# Patient Record
Sex: Female | Born: 1990 | Race: Black or African American | Hispanic: No | Marital: Single | Smoking: Current every day smoker
Health system: Southern US, Community
[De-identification: ages and names within clinical notes are randomized; demographics above are authoritative.]

## PROBLEM LIST (undated history)

## (undated) DIAGNOSIS — Z3490 Encounter for supervision of normal pregnancy, unspecified, unspecified trimester: Secondary | ICD-10-CM

## (undated) DIAGNOSIS — Z789 Other specified health status: Secondary | ICD-10-CM

## (undated) DIAGNOSIS — D219 Benign neoplasm of connective and other soft tissue, unspecified: Secondary | ICD-10-CM

## (undated) HISTORY — PX: NO PAST SURGERIES: SHX2092

## (undated) HISTORY — DX: Other specified health status: Z78.9

---

## 2009-03-29 ENCOUNTER — Emergency Department (HOSPITAL_COMMUNITY): Admission: EM | Admit: 2009-03-29 | Discharge: 2009-03-29 | Payer: Self-pay | Admitting: Emergency Medicine

## 2012-09-02 ENCOUNTER — Emergency Department (HOSPITAL_COMMUNITY)
Admission: EM | Admit: 2012-09-02 | Discharge: 2012-09-03 | Disposition: A | Discharge status: Home / Self Care | Payer: Self-pay | Attending: Emergency Medicine | Admitting: Emergency Medicine

## 2012-09-02 ENCOUNTER — Encounter (HOSPITAL_COMMUNITY): Payer: Self-pay | Admitting: *Deleted

## 2012-09-02 ENCOUNTER — Emergency Department (HOSPITAL_COMMUNITY): Payer: Self-pay

## 2012-09-02 DIAGNOSIS — R498 Other voice and resonance disorders: Secondary | ICD-10-CM | POA: Insufficient documentation

## 2012-09-02 DIAGNOSIS — R51 Headache: Secondary | ICD-10-CM | POA: Insufficient documentation

## 2012-09-02 DIAGNOSIS — M542 Cervicalgia: Secondary | ICD-10-CM | POA: Insufficient documentation

## 2012-09-02 DIAGNOSIS — R509 Fever, unspecified: Secondary | ICD-10-CM | POA: Insufficient documentation

## 2012-09-02 DIAGNOSIS — R599 Enlarged lymph nodes, unspecified: Secondary | ICD-10-CM | POA: Insufficient documentation

## 2012-09-02 DIAGNOSIS — R221 Localized swelling, mass and lump, neck: Secondary | ICD-10-CM | POA: Insufficient documentation

## 2012-09-02 DIAGNOSIS — R1310 Dysphagia, unspecified: Secondary | ICD-10-CM | POA: Insufficient documentation

## 2012-09-02 DIAGNOSIS — R220 Localized swelling, mass and lump, head: Secondary | ICD-10-CM | POA: Insufficient documentation

## 2012-09-02 DIAGNOSIS — J039 Acute tonsillitis, unspecified: Secondary | ICD-10-CM | POA: Insufficient documentation

## 2012-09-02 LAB — RAPID STREP SCREEN (MED CTR MEBANE ONLY): Streptococcus, Group A Screen (Direct): NEGATIVE

## 2012-09-02 MED ORDER — HYDROMORPHONE HCL PF 1 MG/ML IJ SOLN
1.0000 mg | Freq: Once | INTRAMUSCULAR | Status: AC
  Administered 2012-09-02: 1 mg via INTRAVENOUS
  Filled 2012-09-02: qty 1

## 2012-09-02 MED ORDER — ACETAMINOPHEN-CODEINE 120-12 MG/5ML PO SOLN
10.0000 mL | Freq: Once | ORAL | Status: AC
  Administered 2012-09-02: 10 mL via ORAL
  Filled 2012-09-02: qty 10

## 2012-09-02 MED ORDER — IOHEXOL 300 MG/ML  SOLN
75.0000 mL | Freq: Once | INTRAMUSCULAR | Status: AC | PRN
  Administered 2012-09-02: 75 mL via INTRAVENOUS

## 2012-09-02 MED ORDER — CLINDAMYCIN HCL 150 MG PO CAPS: 150.0000 mg | ORAL_CAPSULE | Freq: Four times a day (QID) | ORAL | Status: DC

## 2012-09-02 MED ORDER — DEXAMETHASONE SODIUM PHOSPHATE 4 MG/ML IJ SOLN
10.0000 mg | Freq: Once | INTRAMUSCULAR | Status: AC
  Administered 2012-09-02: 10 mg via INTRAVENOUS
  Filled 2012-09-02: qty 3

## 2012-09-02 MED ORDER — CLINDAMYCIN PHOSPHATE 600 MG/50ML IV SOLN
600.0000 mg | Freq: Once | INTRAVENOUS | Status: AC
  Administered 2012-09-02: 600 mg via INTRAVENOUS
  Filled 2012-09-02: qty 50

## 2012-09-02 MED ORDER — HYDROCODONE-ACETAMINOPHEN 5-325 MG PO TABS: 1.0000 | ORAL_TABLET | ORAL | Status: DC | PRN

## 2012-09-02 NOTE — ED Notes (Signed)
Sore throat for 1 week

## 2012-09-02 NOTE — ED Provider Notes (Signed)
History     CSN: [id number]  Arrival date & time 09/02/12  4098   First MD Initiated Contact with Patient 09/02/12 1928      Chief Complaint  Patient presents with  . Sore Throat    (Consider location/radiation/quality/duration/timing/severity/associated sxs/prior treatment) Patient is a 22 y.o. female presenting with pharyngitis. The history is provided by the patient.  Sore Throat This is a new problem. The current episode started in the past 7 days. The problem occurs constantly. The problem has been gradually worsening. Associated symptoms include a fever, headaches, neck pain, a sore throat and swollen glands. Pertinent negatives include no congestion, coughing, nausea, rash or vomiting. The symptoms are aggravated by eating and swallowing.   Alyssa Owens is a  22 y.o. female who presents to the ED with sore throat and difficulty swallowing. The symptoms started a week ago and have gotten worse. She has noted swelling on the left side of her neck and severe pain with trying to eat solid foods.   History reviewed. No pertinent past medical history.  History reviewed. No pertinent past surgical history.  History reviewed. No pertinent family history.  History  Substance Use Topics  . Smoking status: Never Smoker   . Smokeless tobacco: Not on file  . Alcohol Use: No    OB History   Grav Para Term Preterm Abortions TAB SAB Ect Mult Living                  Review of Systems  Constitutional: Positive for fever.  HENT: Positive for sore throat, trouble swallowing, neck pain and voice change. Negative for congestion.   Respiratory: Negative for cough.   Gastrointestinal: Negative for nausea and vomiting.  Skin: Negative for rash.  Allergic/Immunologic: Negative for immunocompromised state.  Neurological: Positive for headaches.  Psychiatric/Behavioral: Negative for confusion. The patient is not nervous/anxious.     Allergies  Review of patient's allergies indicates  no known allergies.  Home Medications  No current outpatient prescriptions on file.  BP 114/74  Pulse 98  Temp(Src) 100.3 F (37.9 C) (Oral)  Resp 20  Ht 5\' 1"  (1.549 m)  Wt 103 lb (46.72 kg)  BMI 19.47 kg/m2  SpO2 99%  LMP 08/06/2012  Physical Exam  Nursing note and vitals reviewed. Constitutional: She is oriented to person, place, and time. She appears well-developed and well-nourished. No distress.  HENT:  Head: Normocephalic and atraumatic.  Right Ear: Tympanic membrane normal.  Left Ear: Tympanic membrane normal.  Nose: Nose normal.  Mouth/Throat: Tonsillar abscesses present.    Eyes: EOM are normal. Pupils are equal, round, and reactive to light.  Neck: Neck supple.  Cardiovascular: Normal rate and regular rhythm.   Pulmonary/Chest: Effort normal and breath sounds normal.  Abdominal: Soft. There is no tenderness.  Musculoskeletal: Normal range of motion. She exhibits no edema.  Lymphadenopathy:    She has cervical adenopathy.  Neurological: She is alert and oriented to person, place, and time. No cranial nerve deficit.  Skin: Skin is warm and dry.  Psychiatric: She has a normal mood and affect. Her behavior is normal. Judgment and thought content normal.   Ct Soft Tissue Neck W Contrast  09/02/2012  *RADIOLOGY REPORT*  Clinical Data: Sore throat, question left tonsillar abscess  CT NECK WITH CONTRAST  Technique:  Multidetector CT imaging of the neck was performed with intravenous contrast. Sagittal and coronal MPR images reconstructed from axial data set.  Contrast: 75mL OMNIPAQUE IOHEXOL 300 MG/ML  SOLN  Comparison:  None  Findings: Visualized intracranial contents unremarkable. Vascular structures grossly patent. Soft tissue swelling identified in the left parapharyngeal space with a poorly defined area of somewhat lower attenuation approximately 1.6 x 1.1 x 2.1 cm in size. Finding is compatible with inflammatory process/focus of infection, likely phlegmon, not yet  representing a well-defined abscess collection. An additional area of low attenuation is seen left parapharyngeal slightly more superiorly 8 x 4 mm image 25.  Narrowing of the oropharynx with displacement left or right. Associated enlarged level II left cervical lymph nodes, few on right. Superior mediastinum clear. Tiny mucosal retention cyst right maxillary sinus. No acute osseous findings. Lung apices clear. Prevertebral soft tissues normal thickness. No abnormal gas collections in the neck or acute osseous findings.  IMPRESSION: Soft tissue swelling left parapharyngeal space with an area of poorly marginated low attenuation consistent with infection/phlegmon; this does not appear to represent a well- defined drainable abscess collection at this time. Additional tiny 8 mm parapharyngeal focus of low attenuation on the left could represent an additional developing abscess collection. Mass effect upon and mild narrowing of the oral pharynx. Reactive adenopathy predominantly left.  Findings called to Dr. Ignacia Palma at 2206 hours on 09/02/2012.   Original Report Authenticated By: Ulyses Southward, M.D.     ED Course: Discussed with Dr. Rito Ehrlich and he referred to ENT.   Procedures (including critical care time)  Consult with Dr. Jenne Pane, ENT on call, He did not feel the patient needed to be admitted at this time. He request we give patient IV Decadron and antibiotics, d/c home with pain medication, antibiotics but would not need additional prednisone. He wants to see her in the office on Monday.   MDM  22 y.o. female with infected tonsils. I have reviewed this patient's vital signs, nurses notes and CT scan. I have discussed with the patient in detail results and plan of care. Patient voices understanding.    Medication List    TAKE these medications       clindamycin 150 MG capsule  Commonly known as:  CLEOCIN  Take 1 capsule (150 mg total) by mouth every 6 (six) hours.     HYDROcodone-acetaminophen 5-325 MG  per tablet  Commonly known as:  NORCO/VICODIN  Take 1 tablet by mouth every 4 (four) hours as needed.           Janne Napoleon, [place] 09/02/12 2340

## 2012-09-03 NOTE — ED Provider Notes (Signed)
Medical screening examination/treatment/procedure(s) were conducted as a shared visit with non-physician practitioner(s) and myself.  I personally evaluated the patient during the encounter 22 yo woman has 7 day Hx sore throat, left tonsillar and left soft palate swelling.  CT shows a phlegmon but no peritonsillar abscess.  Advised by ENT consultant to give ABX, steroid injection, F/U in his office in 3 days.  Carleene Cooper III, MD 09/03/12 [phone]

## 2013-02-06 ENCOUNTER — Encounter (HOSPITAL_COMMUNITY): Payer: Self-pay | Admitting: *Deleted

## 2013-02-06 ENCOUNTER — Emergency Department (HOSPITAL_COMMUNITY)
Admission: EM | Admit: 2013-02-06 | Discharge: 2013-02-06 | Disposition: A | Discharge status: Home / Self Care | Payer: Self-pay | Attending: Emergency Medicine | Admitting: Emergency Medicine

## 2013-02-06 DIAGNOSIS — R509 Fever, unspecified: Secondary | ICD-10-CM | POA: Insufficient documentation

## 2013-02-06 DIAGNOSIS — J039 Acute tonsillitis, unspecified: Secondary | ICD-10-CM | POA: Insufficient documentation

## 2013-02-06 DIAGNOSIS — IMO0001 Reserved for inherently not codable concepts without codable children: Secondary | ICD-10-CM | POA: Insufficient documentation

## 2013-02-06 MED ORDER — MAGIC MOUTHWASH W/LIDOCAINE: 5.0000 mL | Freq: Three times a day (TID) | ORAL | Status: DC

## 2013-02-06 MED ORDER — AMOXICILLIN 500 MG PO CAPS: 500.0000 mg | ORAL_CAPSULE | Freq: Three times a day (TID) | ORAL | Status: DC

## 2013-02-06 NOTE — ED Notes (Signed)
Sore throat for 1 week, body aches

## 2013-02-08 NOTE — ED Provider Notes (Signed)
CSN: [id number]     Arrival date & time 02/06/13  1217 History   First MD Initiated Contact with Patient 02/06/13 1230     Chief Complaint  Patient presents with  . Sore Throat   (Consider location/radiation/quality/duration/timing/severity/associated sxs/prior Treatment) Patient is a 22 y.o. female presenting with pharyngitis. The history is provided by the patient.  Sore Throat This is a new problem. The current episode started in the past 7 days. The problem occurs constantly. The problem has been unchanged. Associated symptoms include a fever, myalgias, a sore throat and swollen glands. Pertinent negatives include no abdominal pain, arthralgias, chills, congestion, coughing, headaches, nausea, neck pain, numbness, rash, visual change, vomiting or weakness. The symptoms are aggravated by swallowing. She has tried nothing for the symptoms. The treatment provided no relief.    History reviewed. No pertinent past medical history. History reviewed. No pertinent past surgical history. History reviewed. No pertinent family history. History  Substance Use Topics  . Smoking status: Never Smoker   . Smokeless tobacco: Not on file  . Alcohol Use: No   OB History   Grav Para Term Preterm Abortions TAB SAB Ect Mult Living                 Review of Systems  Constitutional: Positive for fever. Negative for chills, activity change and appetite change.  HENT: Positive for sore throat. Negative for ear pain, congestion, facial swelling, trouble swallowing, neck pain, neck stiffness and voice change.   Eyes: Negative for pain and visual disturbance.  Respiratory: Negative for cough and shortness of breath.   Gastrointestinal: Negative for nausea, vomiting and abdominal pain.  Musculoskeletal: Positive for myalgias. Negative for arthralgias.  Skin: Negative for color change and rash.  Neurological: Negative for dizziness, facial asymmetry, speech difficulty, weakness, numbness and headaches.   Hematological: Negative for adenopathy.  All other systems reviewed and are negative.    Allergies  Review of patient's allergies indicates no known allergies.  Home Medications   Current Outpatient Rx  Name  Route  Sig  Dispense  Refill  . Alum & Mag Hydroxide-Simeth (MAGIC MOUTHWASH W/LIDOCAINE) SOLN   Oral   Take 5 mLs by mouth 3 (three) times daily. Swish and spit , do not swallow   120 mL   0   . amoxicillin (AMOXIL) 500 MG capsule   Oral   Take 1 capsule (500 mg total) by mouth 3 (three) times daily. For 10 days   30 capsule   0   . clindamycin (CLEOCIN) 150 MG capsule   Oral   Take 1 capsule (150 mg total) by mouth every 6 (six) hours.   28 capsule   0   . HYDROcodone-acetaminophen (NORCO/VICODIN) 5-325 MG per tablet   Oral   Take 1 tablet by mouth every 4 (four) hours as needed.   20 tablet   0    BP 120/67  Pulse 105  Temp(Src) 100.5 F (38.1 C)  Resp 18  Ht 5\' 1"  (1.549 m)  Wt 110 lb (49.896 kg)  BMI 20.8 kg/m2  SpO2 100%  LMP 01/23/2013 Physical Exam  Nursing note and vitals reviewed. Constitutional: She is oriented to person, place, and time. She appears well-developed and well-nourished. No distress.  HENT:  Head: Normocephalic and atraumatic.  Right Ear: Tympanic membrane and ear canal normal.  Left Ear: Tympanic membrane and ear canal normal.  Mouth/Throat: Uvula is midline and mucous membranes are normal. No trismus in the jaw. No edematous. Oropharyngeal exudate,  posterior oropharyngeal edema and posterior oropharyngeal erythema present. No tonsillar abscesses.  Neck: Normal range of motion, full passive range of motion without pain and phonation normal. Neck supple.  Cardiovascular: Normal rate, regular rhythm and normal heart sounds.   No murmur heard. Pulmonary/Chest: Effort normal and breath sounds normal. No stridor. No respiratory distress.  Abdominal: Soft. She exhibits no distension. There is no hepatosplenomegaly or splenomegaly.  There is no tenderness.  Musculoskeletal: Normal range of motion.  Lymphadenopathy:    She has cervical adenopathy.       Right cervical: Superficial cervical adenopathy present.       Left cervical: Superficial cervical adenopathy present.  Neurological: She is alert and oriented to person, place, and time. She exhibits normal muscle tone. Coordination normal.  Skin: Skin is warm and dry.    ED Course  Procedures (including critical care time) Labs Review Labs Reviewed - No data to display Imaging Review No results found.  MDM   1. Tonsillitis     Airway patent,  Pt handles secretions well.  No PTA.  Appears stable for discharge.  Return precautions given.  Will treat with magic mouthwash and amoxil   Kelita Wallis L. Trisha Mangle, PA-C 02/08/13 1431

## 2013-02-11 NOTE — ED Provider Notes (Signed)
Medical screening examination/treatment/procedure(s) were performed by non-physician practitioner and as supervising physician I was immediately available for consultation/collaboration.    Rajveer Handler J. Enio Hornback, MD 02/11/13 0728 

## 2013-07-31 ENCOUNTER — Emergency Department (HOSPITAL_COMMUNITY)
Admission: EM | Admit: 2013-07-31 | Discharge: 2013-07-31 | Disposition: A | Discharge status: Home / Self Care | Payer: Self-pay | Attending: Emergency Medicine | Admitting: Emergency Medicine

## 2013-07-31 ENCOUNTER — Encounter (HOSPITAL_COMMUNITY): Payer: Self-pay | Admitting: Emergency Medicine

## 2013-07-31 DIAGNOSIS — J039 Acute tonsillitis, unspecified: Secondary | ICD-10-CM | POA: Insufficient documentation

## 2013-07-31 MED ORDER — MAGIC MOUTHWASH W/LIDOCAINE: 5.0000 mL | Freq: Three times a day (TID) | ORAL | Status: DC | PRN

## 2013-07-31 MED ORDER — AMOXICILLIN 500 MG PO CAPS: 500.0000 mg | ORAL_CAPSULE | Freq: Three times a day (TID) | ORAL | Status: DC

## 2013-07-31 NOTE — Discharge Instructions (Signed)
Tonsillitis °Tonsillitis is an infection of the throat. This infection causes the tonsils to become red, tender, and puffy (swollen). Tonsils are groups of tissue at the back of your throat. If bacteria caused your infection, antibiotic medicine will be given to you. Sometimes symptoms of tonsillitis can be relieved with the use of steroid medicine. If your tonsillitis is severe and happens often, you may need to get your tonsils removed (tonsillectomy). °HOME CARE  °· Rest and sleep often. °· Drink enough fluids to keep your pee (urine) clear or pale yellow. °· While your throat is sore, eat soft or liquid foods like: °· Soup. °· Ice cream. °· Instant breakfast drinks. °· Eat frozen ice pops. °· Gargle with a warm or cold liquid to help soothe the throat. Gargle with a water and salt mix. Mix 1/4 teaspoon of salt and 1/4 teaspoon of baking soda in 1 cup of water. °· Only take medicines as told by your doctor. °· If you are given medicines (antibiotics), take them as told. Finish them even if you start to feel better. °GET HELP RIGHT AWAY IF:  °· You throw up (vomit). °· You have a very bad headache. °· You have a stiff neck. °· You have chest pain. °· You have trouble breathing or swallowing. °· You have bad throat pain, drooling, or your voice changes. °· You have bad pain not helped by medicine. °· You cannot fully open your mouth. °· You have redness, puffiness, or bad pain in the neck. °· You have a fever. °· You have a rash. °· You cough up green, yellow-brown, or bloody fluid. °· You cannot swallow liquids or food for 24 hours. °· You notice that only one of your tonsils is swollen. °MAKE SURE YOU:  °· Understand these instructions. °· Will watch your condition. °· Will get help right away if you are not doing well or get worse. °Document Released: 11/11/2007 Document Revised: 01/25/2013 Document Reviewed: 11/11/2012 °[employer]® Patient Information ©2014 [employer]. ° °

## 2013-07-31 NOTE — ED Notes (Signed)
Pt c/o sore throat x2 days 

## 2013-07-31 NOTE — ED Provider Notes (Signed)
CSN: [id number]     Arrival date & time 07/31/13  1119 History  This chart was scribed for non-physician practitioner Hale Bogus, PA-C working with Nat Christen, MD by Anastasia Pall, ED scribe. This patient was seen in room APFT22/APFT22 and the patient's care was started at 12:24 PM.    Chief Complaint  Patient presents with  . Sore Throat   (Consider location/radiation/quality/duration/timing/severity/associated sxs/prior Treatment) Patient is a 23 y.o. female presenting with pharyngitis. The history is provided by the patient. No language interpreter was used.  Sore Throat This is a new problem. The current episode started 2 days ago. The problem occurs constantly. The problem has not changed since onset.Pertinent negatives include no chest pain, no abdominal pain and no shortness of breath. The symptoms are aggravated by swallowing. She has tried nothing for the symptoms.   HPI Comments: Alyssa Owens is a 23 y.o. female who presents to the Emergency Department complaining of constant, sore throat, onset 2 days ago. She reports not eating as much due to pain when swallowing. She denies being around any contacts with strep throat recently. She reports h/o tonsillitis. She reports having similar sore throat 4 times in past year. She denies having PCP. She denies fever, cough, congestion, and any other associated symptoms. She denies allergy to antibiotics. She last took antibiotics several months ago.   PCP - No PCP Per Patient  History reviewed. No pertinent past medical history. History reviewed. No pertinent past surgical history. No family history on file. History  Substance Use Topics  . Smoking status: Never Smoker   . Smokeless tobacco: Not on file  . Alcohol Use: No   OB History   Grav Para Term Preterm Abortions TAB SAB Ect Mult Living                 Review of Systems  Constitutional: Negative for fever, chills and fatigue.  HENT: Positive for sore throat. Negative  for congestion and trouble swallowing.   Respiratory: Negative for cough, shortness of breath and wheezing.   Cardiovascular: Negative for chest pain and palpitations.  Gastrointestinal: Negative for nausea, vomiting, abdominal pain and blood in stool.  Genitourinary: Negative for dysuria, hematuria and flank pain.  Musculoskeletal: Negative for arthralgias, back pain, myalgias, neck pain and neck stiffness.  Skin: Negative for rash.  Neurological: Negative for dizziness, weakness and numbness.  Hematological: Does not bruise/bleed easily.   Allergies  Review of patient's allergies indicates no known allergies.  Home Medications   Current Outpatient Rx  Name  Route  Sig  Dispense  Refill  . Phenylephrine-DM-GG-APAP (TYLENOL COLD/FLU SEVERE) 5-10-200-325 MG TABS   Oral   Take 2 tablets by mouth 2 (two) times daily.          BP 103/54  Pulse 62  Temp(Src) 98.7 F (37.1 C)  Resp 18  Ht 5\' 1"  (1.549 m)  Wt 110 lb (49.896 kg)  BMI 20.80 kg/m2  SpO2 100%  LMP 07/24/2013  Physical Exam  Nursing note and vitals reviewed. Constitutional: She is oriented to person, place, and time. She appears well-developed and well-nourished. No distress.  HENT:  Head: Normocephalic and atraumatic.  Right Ear: Tympanic membrane, external ear and ear canal normal.  Left Ear: Tympanic membrane, external ear and ear canal normal.  Mouth/Throat: Uvula is midline and mucous membranes are normal. Posterior oropharyngeal edema (mild) and posterior oropharyngeal erythema present. No oropharyngeal exudate or tonsillar abscesses.  Eyes: EOM are normal. No scleral icterus.  Neck:  Normal range of motion. Neck supple. No thyromegaly present.  Cardiovascular: Normal rate, regular rhythm and normal heart sounds.   No murmur heard. Pulmonary/Chest: Effort normal and breath sounds normal. No respiratory distress. She has no wheezes. She has no rales. She exhibits no tenderness.  Musculoskeletal: Normal range  of motion. She exhibits no tenderness.  Lymphadenopathy:    She has no cervical adenopathy.  Neurological: She is alert and oriented to person, place, and time. She exhibits normal muscle tone. Coordination normal.  Skin: Skin is warm and dry.   ED Course  Procedures (including critical care time)  DIAGNOSTIC STUDIES: Oxygen Saturation is 100% on room air, normal by my interpretation.    COORDINATION OF CARE: 12:28 PM-Discussed treatment plan with pt at bedside and pt agreed to plan.   Labs Review Labs Reviewed - No data to display Imaging Review No results found.  EKG Interpretation   None      Medications - No data to display  MDM   Final diagnoses:  Tonsillitis   Pt is well appearing, VSS.  No concerning sx's for PTA.  Pt agrees to close f/u with clinic.    The patient appears reasonably screened and/or stabilized for discharge and I doubt any other medical condition or other [facility] requiring further screening, evaluation, or treatment in the ED at this time prior to discharge.   I personally performed the services described in this documentation, which was scribed in my presence. The recorded information has been reviewed and is accurate.    Jaxston Chohan L. Vanessa Avoca, PA-C 08/02/13 2204

## 2013-08-03 NOTE — ED Provider Notes (Signed)
Medical screening examination/treatment/procedure(s) were performed by non-physician practitioner and as supervising physician I was immediately available for consultation/collaboration.  EKG Interpretation   None        Nat Christen, MD 08/03/13 2039

## 2014-01-28 IMAGING — CT CT NECK W/ CM
3 of 4 series · 13 of 33 positions shown, 16 images · IV contrast (omnipaque)
Comparison: None

CLINICAL DATA: Sore throat, question left tonsillar abscess

CT NECK WITH CONTRAST
TECHNIQUE: Multidetector CT imaging of the neck was performed with
intravenous contrast. Sagittal and coronal MPR images reconstructed
from axial data set.
Contrast: 75mL OMNIPAQUE IOHEXOL 300 MG/ML  SOLN

[Series 2: soft tissue neck 2.0 b31s · axial · 0.48mm/px · z∈[+1030,+1180]mm · 5 of 113 slices shown, 7 images]
[im 19/113  soft-tissue]
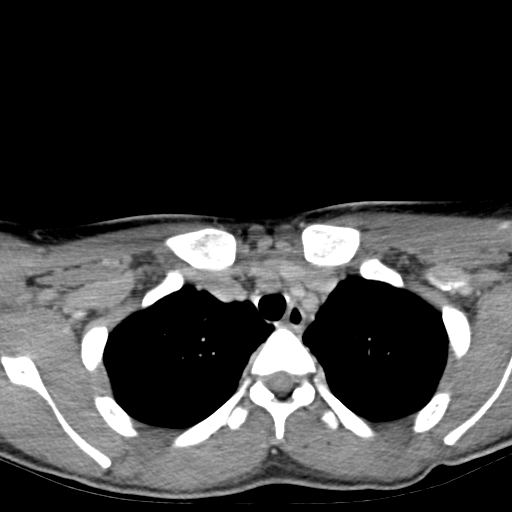
[im 19/113  bone]
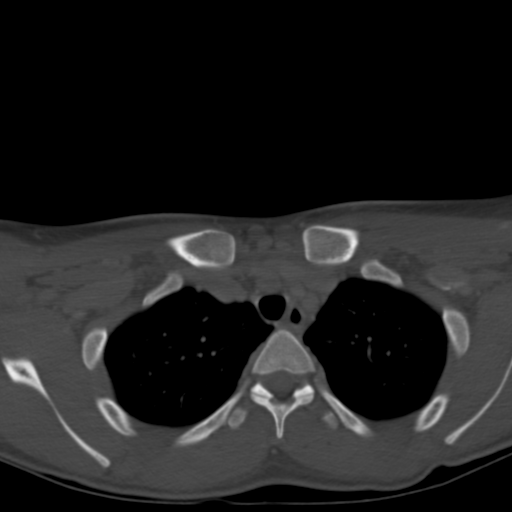
[im 38/113  bone]
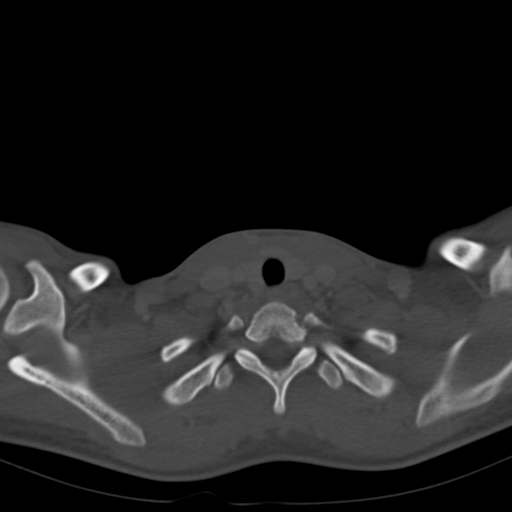
[im 57/113  bone]
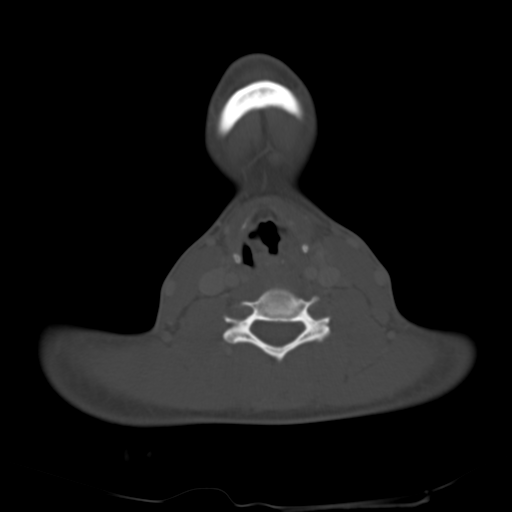
[im 75/113  bone]
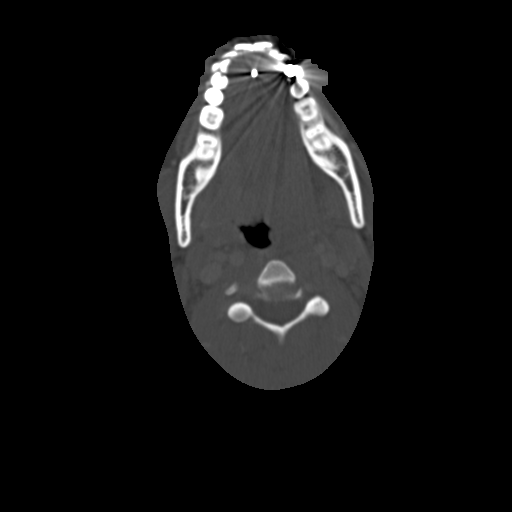
[im 94/113  soft-tissue]
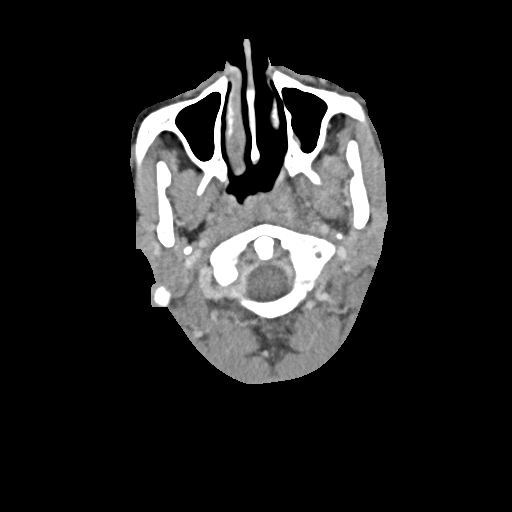
[im 94/113  bone]
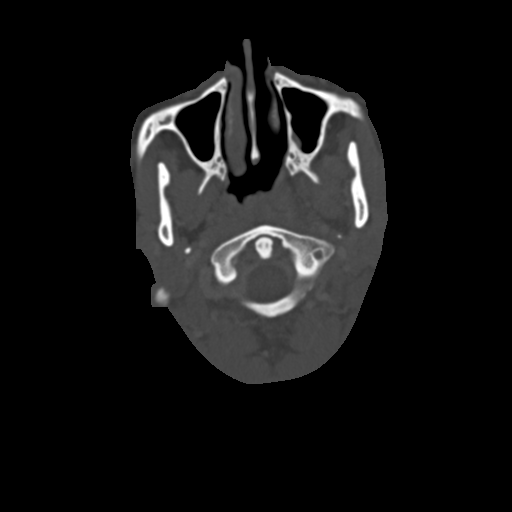

[Series 4: neck 2.0 soft tissue sag · sagittal · 0.40mm/px · 5 of 114 slices shown, 6 images]
[im 38/114  bone]
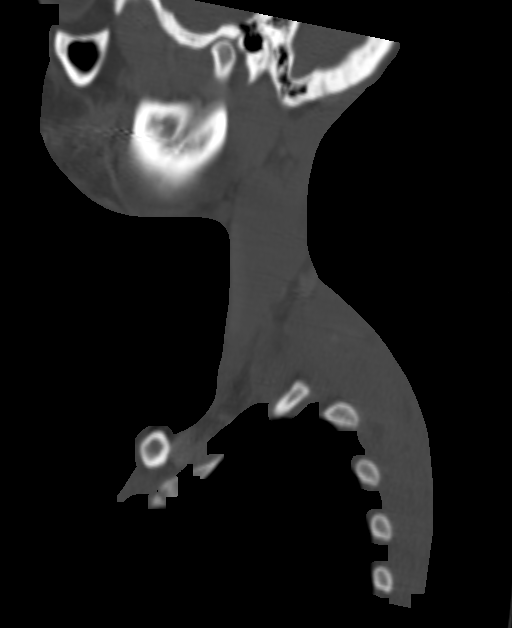
[im 48/114  bone]
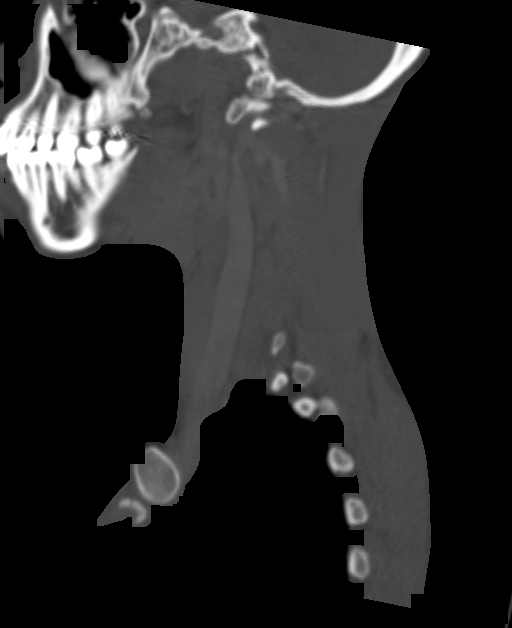
[im 57/114  soft-tissue]
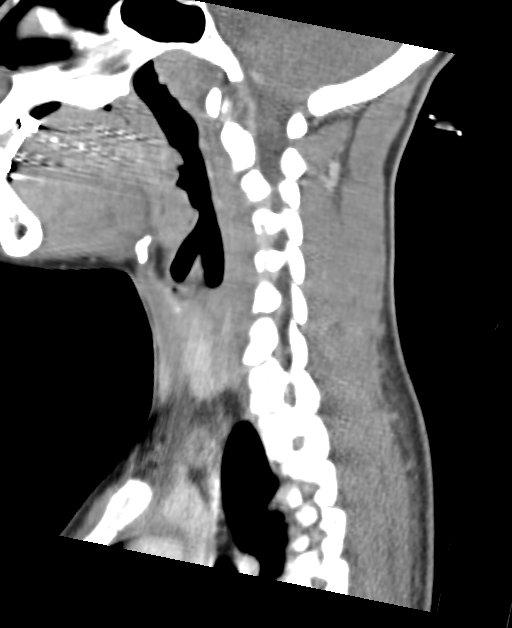
[im 57/114  bone]
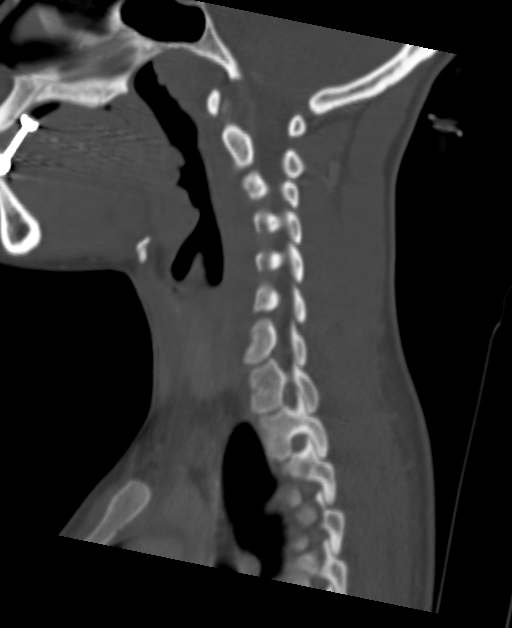
[im 66/114  bone]
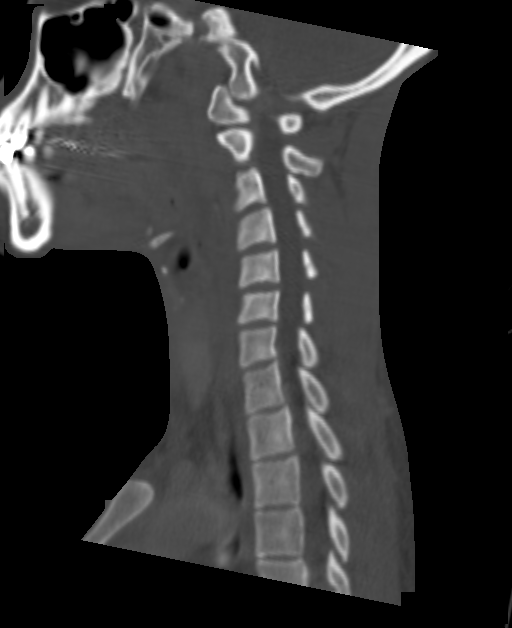
[im 76/114  bone]
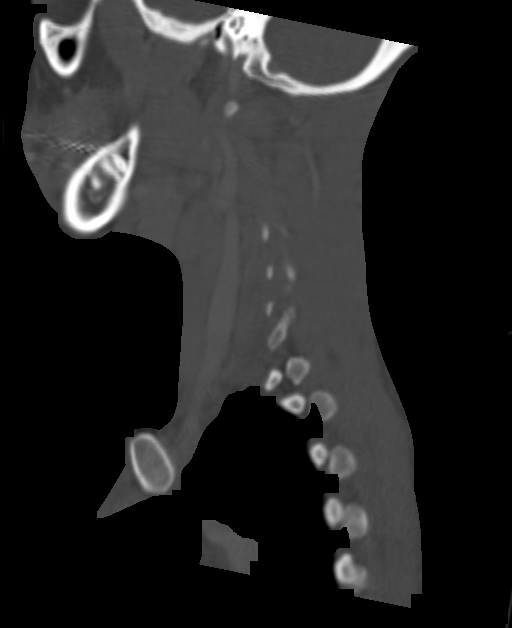

[Series 5: neck 2.0 soft tissue coro · coronal · 0.44mm/px · 3 of 97 slices shown]
[im 20/97  bone]
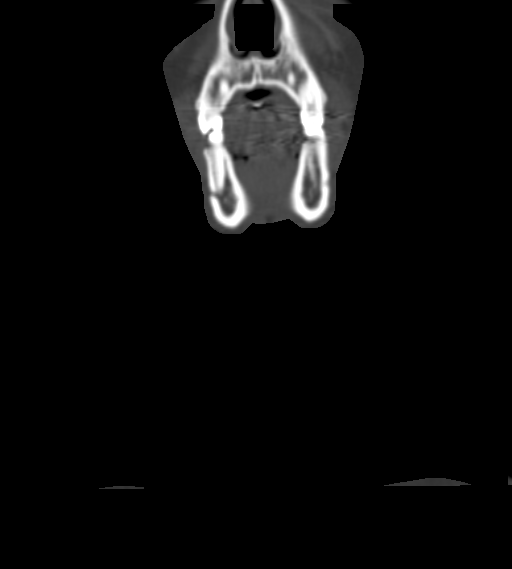
[im 39/97  bone]
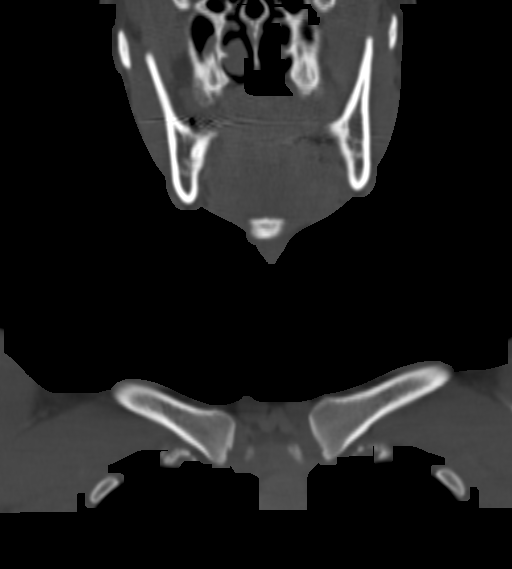
[im 58/97  bone]
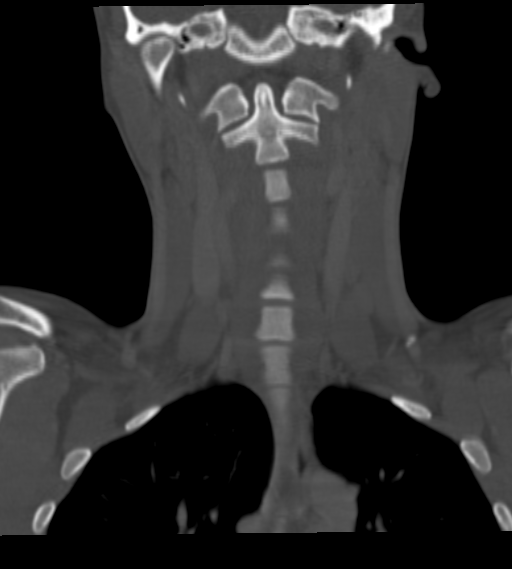

[13 of 33 positions shown; findings below may reference images not displayed]

FINDINGS: Visualized intracranial contents unremarkable.
Vascular structures grossly patent.
Soft tissue swelling identified in the left parapharyngeal space
with a poorly defined area of somewhat lower attenuation
approximately 1.6 x 1.1 x 2.1 cm in size.
Finding is compatible with inflammatory process/focus of infection,
likely phlegmon, not yet representing a well-defined abscess
collection.
An additional area of low attenuation is seen left parapharyngeal
slightly more superiorly 8 x 4 mm image 25.

Narrowing of the oropharynx with displacement left or right.
Associated enlarged level II left cervical lymph nodes, few on
right. Superior mediastinum clear.
Tiny mucosal retention cyst right maxillary sinus.
No acute osseous findings.
Lung apices clear.
Prevertebral soft tissues normal thickness.
No abnormal gas collections in the neck or acute osseous findings.
IMPRESSION: Soft tissue swelling left parapharyngeal space with an area of
poorly marginated low attenuation consistent with
infection/phlegmon; this does not appear to represent a well-
defined drainable abscess collection at this time.
Additional tiny 8 mm parapharyngeal focus of low attenuation on the
left could represent an additional developing abscess collection.
Mass effect upon and mild narrowing of the oral pharynx.
Reactive adenopathy predominantly left.

Findings called to Dr. Chai at 0071 hours on 09/02/2012.

## 2015-03-21 ENCOUNTER — Other Ambulatory Visit: Payer: Self-pay | Admitting: Obstetrics & Gynecology

## 2015-03-21 DIAGNOSIS — O3680X0 Pregnancy with inconclusive fetal viability, not applicable or unspecified: Secondary | ICD-10-CM

## 2015-03-25 ENCOUNTER — Ambulatory Visit (INDEPENDENT_AMBULATORY_CARE_PROVIDER_SITE_OTHER): Payer: Medicaid Other

## 2015-03-25 DIAGNOSIS — O3680X0 Pregnancy with inconclusive fetal viability, not applicable or unspecified: Secondary | ICD-10-CM | POA: Diagnosis not present

## 2015-03-25 NOTE — Progress Notes (Signed)
[place] 7+6wks,single IUP w/ys,pos fht 149 bpm, crl 11.73mm,two post.subserosal fibroids,(#1) post rt 4.3 x 4.6 x 3 cm, (#2) post lt  5.1  X 4.4 x 4cm

## 2015-04-08 ENCOUNTER — Encounter: Payer: Self-pay | Admitting: Women's Health

## 2015-04-08 ENCOUNTER — Ambulatory Visit (INDEPENDENT_AMBULATORY_CARE_PROVIDER_SITE_OTHER): Payer: Medicaid Other | Admitting: Women's Health

## 2015-04-08 VITALS — BP 102/68 | HR 88 | Wt 124.0 lb

## 2015-04-08 DIAGNOSIS — D259 Leiomyoma of uterus, unspecified: Secondary | ICD-10-CM | POA: Insufficient documentation

## 2015-04-08 DIAGNOSIS — Z369 Encounter for antenatal screening, unspecified: Secondary | ICD-10-CM

## 2015-04-08 DIAGNOSIS — Z331 Pregnant state, incidental: Secondary | ICD-10-CM

## 2015-04-08 DIAGNOSIS — Z0283 Encounter for blood-alcohol and blood-drug test: Secondary | ICD-10-CM

## 2015-04-08 DIAGNOSIS — F17200 Nicotine dependence, unspecified, uncomplicated: Secondary | ICD-10-CM | POA: Insufficient documentation

## 2015-04-08 DIAGNOSIS — Z3401 Encounter for supervision of normal first pregnancy, first trimester: Secondary | ICD-10-CM | POA: Diagnosis not present

## 2015-04-08 DIAGNOSIS — O99331 Smoking (tobacco) complicating pregnancy, first trimester: Secondary | ICD-10-CM | POA: Diagnosis not present

## 2015-04-08 DIAGNOSIS — Z3682 Encounter for antenatal screening for nuchal translucency: Secondary | ICD-10-CM

## 2015-04-08 DIAGNOSIS — Z23 Encounter for immunization: Secondary | ICD-10-CM | POA: Diagnosis not present

## 2015-04-08 DIAGNOSIS — D252 Subserosal leiomyoma of uterus: Secondary | ICD-10-CM

## 2015-04-08 DIAGNOSIS — Z1389 Encounter for screening for other disorder: Secondary | ICD-10-CM

## 2015-04-08 DIAGNOSIS — Z3400 Encounter for supervision of normal first pregnancy, unspecified trimester: Secondary | ICD-10-CM | POA: Insufficient documentation

## 2015-04-08 LAB — POCT URINALYSIS DIPSTICK
GLUCOSE UA: NEGATIVE
Leukocytes, UA: NEGATIVE
NITRITE UA: NEGATIVE
Protein, UA: NEGATIVE
RBC UA: NEGATIVE

## 2015-04-08 MED ORDER — PROMETHAZINE HCL 25 MG PO TABS: 12.5000 mg | ORAL_TABLET | Freq: Four times a day (QID) | ORAL | Status: DC | PRN

## 2015-04-08 NOTE — Patient Instructions (Signed)

## 2015-04-08 NOTE — Progress Notes (Signed)
  Subjective:  Alyssa Owens is a 24 y.o. G58P0 African American female at [redacted]w[redacted]d by LMP c/w 7wk u/s, being seen today for her first obstetrical visit.  Her obstetrical history is significant for primigravida, smoker: 1/2ppd prior to pregnancy, now at 5 cigs/day.  Pregnancy history fully reviewed.  Patient reports nausea, some vomiting- requests meds. Denies vb, cramping, uti s/s, abnormal/malodorous vag d/c, or vulvovaginal itching/irritation.  BP 102/68 mmHg  Pulse 88  Wt 124 lb (56.246 kg)  LMP 01/29/2015 (Approximate)  HISTORY: OB History  Gravida Para Term Preterm AB SAB TAB Ectopic Multiple Living  1             # Outcome Date GA Lbr Len/2nd Weight Sex Delivery Anes PTL Lv  1 Current              Past Medical History  Diagnosis Date  . Medical history non-contributory    Past Surgical History  Procedure Laterality Date  . No past surgeries     Family History  Problem Relation Age of Onset  . Arthritis Mother   . Cancer Mother     breast  . Hypertension Mother   . Heart disease Father     Exam   System:     General: Well developed & nourished, no acute distress   Skin: Warm & dry, normal coloration and turgor, no rashes   Neurologic: Alert & oriented, normal mood   Cardiovascular: Regular rate & rhythm   Respiratory: Effort & rate normal, LCTAB, acyanotic   Abdomen: Soft, non tender   Extremities: normal strength, tone  Thin prep pap smear neg 2015 @ [facility] FHR: 181 via doppler   Assessment:   Pregnancy: G1P0 Patient Active Problem List   Diagnosis Date Noted  . Supervision of normal first pregnancy 04/08/2015    Priority: High  . Uterine fibroids 04/08/2015  . Smoker 04/08/2015    [redacted]w[redacted]d G1P0 New OB visit N/V pregnancy Smoker Uterine fibroids  Plan:  Initial labs drawn Continue prenatal vitamins Problem list reviewed and updated Reviewed n/v relief measures and warning s/s to report No diclegis samples available, Mcaid is pending, rx'd  phenergan, to call us back when [facility] goes through and will rx diclegis Reviewed recommended weight gain based on pre-gravid BMI Encouraged well-balanced diet Genetic Screening discussed Integrated Screen: requested Cystic fibrosis screening discussed declined Ultrasound discussed; fetal survey: requested Follow up in 2 weeks for visit and 1st it/nt [facility] completed [facility]FP[facility]nership offered, accepted, referral faxed  Flu shot today Smokes 1/2pp/day, advised cessation, discussed risks to fetus while pregnant, to infant pp, and to herself. Offered QuitlineNC, accepted, referral sent.     Tawnya Crook CNM, [facility] 04/08/2015 9:48 AM

## 2015-04-09 ENCOUNTER — Encounter: Payer: Self-pay | Admitting: Women's Health

## 2015-04-09 DIAGNOSIS — D573 Sickle-cell trait: Secondary | ICD-10-CM | POA: Insufficient documentation

## 2015-04-09 LAB — URINE CULTURE: Organism ID, Bacteria: NO GROWTH

## 2015-04-09 LAB — HEPATITIS B SURFACE ANTIGEN: Hepatitis B Surface Ag: NEGATIVE

## 2015-04-09 LAB — PMP SCREEN PROFILE (10S), URINE
Amphetamine Screen, Ur: NEGATIVE ng/mL
BENZODIAZEPINE SCREEN, URINE: NEGATIVE ng/mL
Barbiturate Screen, Ur: NEGATIVE ng/mL
Cannabinoids Ur Ql Scn: POSITIVE ng/mL
Cocaine(Metab.)Screen, Urine: NEGATIVE ng/mL
Creatinine(Crt), U: 163.9 mg/dL (ref 20.0–300.0)
Methadone Scn, Ur: NEGATIVE ng/mL
OXYCODONE+OXYMORPHONE UR QL SCN: NEGATIVE ng/mL
Opiate Scrn, Ur: NEGATIVE ng/mL
PCP Scrn, Ur: NEGATIVE ng/mL
Ph of Urine: 5.9 (ref 4.5–8.9)
Propoxyphene, Screen: NEGATIVE ng/mL

## 2015-04-09 LAB — CBC
HEMATOCRIT: 41.3 % (ref 34.0–46.6)
HEMOGLOBIN: 14.5 g/dL (ref 11.1–15.9)
MCH: 30.2 pg (ref 26.6–33.0)
MCHC: 35.1 g/dL (ref 31.5–35.7)
MCV: 86 fL (ref 79–97)
NRBC: 0 % (ref 0–0)
Platelets: 201 10*3/uL (ref 150–379)
RBC: 4.8 x10E6/uL (ref 3.77–5.28)
RDW: 14.3 % (ref 12.3–15.4)
WBC: 12.8 10*3/uL — AB (ref 3.4–10.8)

## 2015-04-09 LAB — GC/CHLAMYDIA PROBE AMP
CHLAMYDIA, DNA PROBE: NEGATIVE
Neisseria gonorrhoeae by PCR: NEGATIVE

## 2015-04-09 LAB — URINALYSIS, ROUTINE W REFLEX MICROSCOPIC
BILIRUBIN UA: NEGATIVE
Glucose, UA: NEGATIVE
Nitrite, UA: NEGATIVE
RBC UA: NEGATIVE
SPEC GRAV UA: 1.016 (ref 1.005–1.030)
Urobilinogen, Ur: 0.2 mg/dL (ref 0.2–1.0)
pH, UA: 6.5 (ref 5.0–7.5)

## 2015-04-09 LAB — MICROSCOPIC EXAMINATION: CASTS: NONE SEEN /LPF

## 2015-04-09 LAB — HIV ANTIBODY (ROUTINE TESTING W REFLEX): HIV SCREEN 4TH GENERATION: NONREACTIVE

## 2015-04-09 LAB — ABO/RH: Rh Factor: POSITIVE

## 2015-04-09 LAB — VARICELLA ZOSTER ANTIBODY, IGG: Varicella zoster IgG: 320 index (ref >165)

## 2015-04-09 LAB — RUBELLA SCREEN: RUBELLA: 6.49 {index} (ref >0.99)

## 2015-04-09 LAB — RPR: RPR: NONREACTIVE

## 2015-04-09 LAB — SPECIMEN STATUS REPORT

## 2015-04-09 LAB — SICKLE CELL SCREEN: SICKLE CELL SCREEN: POSITIVE — AB

## 2015-04-09 LAB — ANTIBODY SCREEN: Antibody Screen: NEGATIVE

## 2015-04-10 ENCOUNTER — Encounter: Payer: Self-pay | Admitting: Women's Health

## 2015-04-10 DIAGNOSIS — F1290 Cannabis use, unspecified, uncomplicated: Secondary | ICD-10-CM | POA: Insufficient documentation

## 2015-04-11 LAB — HGB FRAC. W/SOLUBILITY
HGB A2 QUANT: 3.4 % — AB (ref 0.7–3.1)
HGB C: 0 %
HGB SOLUBILITY: POSITIVE — AB
Hgb A: 57.5 % — ABNORMAL LOW (ref 94.0–98.0)
Hgb F Quant: 0 % (ref 0.0–2.0)
Hgb S: 39.1 % — ABNORMAL HIGH

## 2015-04-11 LAB — SPECIMEN STATUS REPORT

## 2015-04-23 ENCOUNTER — Encounter: Payer: Self-pay | Admitting: Women's Health

## 2015-04-23 ENCOUNTER — Ambulatory Visit (INDEPENDENT_AMBULATORY_CARE_PROVIDER_SITE_OTHER): Payer: Medicaid Other

## 2015-04-23 ENCOUNTER — Ambulatory Visit (INDEPENDENT_AMBULATORY_CARE_PROVIDER_SITE_OTHER): Payer: Medicaid Other | Admitting: Women's Health

## 2015-04-23 VITALS — BP 124/62 | HR 68 | Wt 122.0 lb

## 2015-04-23 DIAGNOSIS — Z3401 Encounter for supervision of normal first pregnancy, first trimester: Secondary | ICD-10-CM

## 2015-04-23 DIAGNOSIS — Z3682 Encounter for antenatal screening for nuchal translucency: Secondary | ICD-10-CM

## 2015-04-23 DIAGNOSIS — F1290 Cannabis use, unspecified, uncomplicated: Secondary | ICD-10-CM

## 2015-04-23 DIAGNOSIS — F1210 Cannabis abuse, uncomplicated: Secondary | ICD-10-CM

## 2015-04-23 DIAGNOSIS — D573 Sickle-cell trait: Secondary | ICD-10-CM

## 2015-04-23 DIAGNOSIS — Z36 Encounter for antenatal screening of mother: Secondary | ICD-10-CM

## 2015-04-23 DIAGNOSIS — Z1389 Encounter for screening for other disorder: Secondary | ICD-10-CM

## 2015-04-23 DIAGNOSIS — O1211 Gestational proteinuria, first trimester: Secondary | ICD-10-CM

## 2015-04-23 DIAGNOSIS — Z331 Pregnant state, incidental: Secondary | ICD-10-CM

## 2015-04-23 DIAGNOSIS — D252 Subserosal leiomyoma of uterus: Secondary | ICD-10-CM

## 2015-04-23 LAB — POCT URINALYSIS DIPSTICK
Glucose, UA: NEGATIVE
KETONES UA: NEGATIVE
Leukocytes, UA: NEGATIVE
Nitrite, UA: NEGATIVE

## 2015-04-23 NOTE — Patient Instructions (Signed)
Constipation  Drink plenty of fluid, preferably water, throughout the day  Eat foods high in fiber such as fruits, vegetables, and grains  Exercise, such as walking, is a good way to keep your bowels regular  Drink warm fluids, especially warm prune juice, or decaf coffee  Eat a 1/2 cup of real oatmeal (not instant), 1/2 cup applesauce, and 1/2-1 cup warm prune juice every day  If needed, you may take Colace (docusate sodium) stool softener once or twice a day to help keep the stool soft. If you are pregnant, wait until you are out of your first trimester (12-14 weeks of pregnancy)  If you still are having problems with constipation, you may take Miralax once daily as needed to help keep your bowels regular.  If you are pregnant, wait until you are out of your first trimester (12-14 weeks of pregnancy)   Tylenol, warm baths as needed for abdominal pain  Second Trimester of Pregnancy The second trimester is from week 13 through week 28, months 4 through 6. The second trimester is often a time when you feel your best. Your body has also adjusted to being pregnant, and you begin to feel better physically. Usually, morning sickness has lessened or quit completely, you may have more energy, and you may have an increase in appetite. The second trimester is also a time when the fetus is growing rapidly. At the end of the sixth month, the fetus is about 9 inches long and weighs about 1 pounds. You will likely begin to feel the baby move (quickening) between 18 and 20 weeks of the pregnancy. BODY CHANGES Your body goes through many changes during pregnancy. The changes vary from woman to woman.   Your weight will continue to increase. You will notice your lower abdomen bulging out.  You may begin to get stretch marks on your hips, abdomen, and breasts.  You may develop headaches that can be relieved by medicines approved by your health care provider.  You may urinate more often because the fetus  is pressing on your bladder.  You may develop or continue to have heartburn as a result of your pregnancy.  You may develop constipation because certain hormones are causing the muscles that push waste through your intestines to slow down.  You may develop hemorrhoids or swollen, bulging veins (varicose veins).  You may have back pain because of the weight gain and pregnancy hormones relaxing your joints between the bones in your pelvis and as a result of a shift in weight and the muscles that support your balance.  Your breasts will continue to grow and be tender.  Your gums may bleed and may be sensitive to brushing and flossing.  Dark spots or blotches (chloasma, mask of pregnancy) may develop on your face. This will likely fade after the baby is born.  A dark line from your belly button to the pubic area (linea nigra) may appear. This will likely fade after the baby is born.  You may have changes in your hair. These can include thickening of your hair, rapid growth, and changes in texture. Some women also have hair loss during or after pregnancy, or hair that feels dry or thin. Your hair will most likely return to normal after your baby is born. WHAT TO EXPECT AT YOUR PRENATAL VISITS During a routine prenatal visit:  You will be weighed to make sure you and the fetus are growing normally.  Your blood pressure will be taken.  Your abdomen  will be measured to track your baby's growth.  The fetal heartbeat will be listened to.  Any test results from the previous visit will be discussed. Your health care provider may ask you:  How you are feeling.  If you are feeling the baby move.  If you have had any abnormal symptoms, such as leaking fluid, bleeding, severe headaches, or abdominal cramping.  If you are using any tobacco products, including cigarettes, chewing tobacco, and electronic cigarettes.  If you have any questions. Other tests that may be performed during your  second trimester include:  Blood tests that check for:  Low iron levels (anemia).  Gestational diabetes (between 24 and 28 weeks).  Rh antibodies.  Urine tests to check for infections, diabetes, or protein in the urine.  An ultrasound to confirm the proper growth and development of the baby.  An amniocentesis to check for possible genetic problems.  Fetal screens for spina bifida and Down syndrome.  HIV (human immunodeficiency virus) testing. Routine prenatal testing includes screening for HIV, unless you choose not to have this test. HOME CARE INSTRUCTIONS   Avoid all smoking, herbs, alcohol, and unprescribed drugs. These chemicals affect the formation and growth of the baby.  Do not use any tobacco products, including cigarettes, chewing tobacco, and electronic cigarettes. If you need help quitting, ask your health care provider. You may receive counseling support and other resources to help you quit.  Follow your health care provider's instructions regarding medicine use. There are medicines that are either safe or unsafe to take during pregnancy.  Exercise only as directed by your health care provider. Experiencing uterine cramps is a good sign to stop exercising.  Continue to eat regular, healthy meals.  Wear a good support bra for breast tenderness.  Do not use hot tubs, steam rooms, or saunas.  Wear your seat belt at all times when driving.  Avoid raw meat, uncooked cheese, cat litter boxes, and soil used by cats. These carry germs that can cause birth defects in the baby.  Take your prenatal vitamins.  Take 1500-2000 mg of calcium daily starting at the 20th week of pregnancy until you deliver your baby.  Try taking a stool softener (if your health care provider approves) if you develop constipation. Eat more high-fiber foods, such as fresh vegetables or fruit and whole grains. Drink plenty of fluids to keep your urine clear or pale yellow.  Take warm sitz baths to  soothe any pain or discomfort caused by hemorrhoids. Use hemorrhoid cream if your health care provider approves.  If you develop varicose veins, wear support hose. Elevate your feet for 15 minutes, 3-4 times a day. Limit salt in your diet.  Avoid heavy lifting, wear low heel shoes, and practice good posture.  Rest with your legs elevated if you have leg cramps or low back pain.  Visit your dentist if you have not gone yet during your pregnancy. Use a soft toothbrush to brush your teeth and be gentle when you floss.  A sexual relationship may be continued unless your health care provider directs you otherwise.  Continue to go to all your prenatal visits as directed by your health care provider. SEEK MEDICAL CARE IF:   You have dizziness.  You have mild pelvic cramps, pelvic pressure, or nagging pain in the abdominal area.  You have persistent nausea, vomiting, or diarrhea.  You have a bad smelling vaginal discharge.  You have pain with urination. SEEK IMMEDIATE MEDICAL CARE IF:   You  have a fever.  You are leaking fluid from your vagina.  You have spotting or bleeding from your vagina.  You have severe abdominal cramping or pain.  You have rapid weight gain or loss.  You have shortness of breath with chest pain.  You notice sudden or extreme swelling of your face, hands, ankles, feet, or legs.  You have not felt your baby move in over an hour.  You have severe headaches that do not go away with medicine.  You have vision changes.   This information is not intended to replace advice given to you by your health care provider. Make sure you discuss any questions you have with your health care provider.   Document Released: 05/19/2001 Document Revised: 06/15/2014 Document Reviewed: 07/26/2012 Elsevier Interactive Patient Education [employer].

## 2015-04-23 NOTE — Progress Notes (Signed)
[place] 12wks,measurements c/w dates,normal ov's bilat, two post subserosal fibroids #1) post rt 5.2 x 4 x 5.4cm, (#2) post lt 5.6 x 4.3 x 4.1cm,crl 50.3cm,NB present,NT 1.20mm

## 2015-04-23 NOTE — Progress Notes (Signed)
Low-risk OB appointment G1P0 [redacted]w[redacted]d Estimated Date of Delivery: 11/05/15 BP 124/62 mmHg  Pulse 68  Wt 122 lb (55.339 kg)  LMP 01/29/2015 (Approximate)  BP, weight, and urine reviewed.  Refer to obstetrical flow sheet for FH & FHR.  No fm yet. Denies cramping, lof, vb, or uti s/s. Constant lower abdominal pain, denies fever/chills, sometimes makes her vomit, feels she is constipated- gave printed relief measures- to let us know if pain not improving or developing new sx Discussed +THC- states she last used few days ago d/t pain- advised complete cessation- discussed pain relief measures. Counseled on [place] trait +, to discuss w/ FOB to see if he's ever been tested, if not to get tested.  Reviewed today's normal nt [place]- post Rt fibroid slightly increased, posterior Lt stable, reviewed warning s/s to report. Plan:  Continue routine obstetrical care  F/U in 4wks for OB appointment and 2nd IT 1st IT/NT today

## 2015-04-24 LAB — URINE CULTURE

## 2015-04-25 LAB — MATERNAL SCREEN, INTEGRATED #1
CROWN RUMP LENGTH MAT SCREEN: 50.3 mm
GEST. AGE ON COLLECTION DATE: 11.7 wk
Maternal Age at EDD: 25.6 years
Nuchal Translucency (NT): 1.6 mm
Number of Fetuses: 1
PAPP-A Value: 820.5 ng/mL
WEIGHT: 122 [lb_av]

## 2015-05-06 ENCOUNTER — Telehealth: Payer: Self-pay | Admitting: Women's Health

## 2015-05-06 NOTE — Telephone Encounter (Signed)
Pt c/o sore throat and runny nose. What can she take? Pt informed can gargle with warm salt water and lozenges for sore throat, OTC plain Robitussin for cough and runny nose. If no improvement call our office back. Pt verbalized understanding.

## 2015-05-21 ENCOUNTER — Ambulatory Visit (INDEPENDENT_AMBULATORY_CARE_PROVIDER_SITE_OTHER): Payer: Medicaid Other | Admitting: Women's Health

## 2015-05-21 ENCOUNTER — Encounter: Payer: Self-pay | Admitting: Women's Health

## 2015-05-21 VITALS — BP 100/60 | HR 72 | Wt 124.0 lb

## 2015-05-21 DIAGNOSIS — Z3402 Encounter for supervision of normal first pregnancy, second trimester: Secondary | ICD-10-CM

## 2015-05-21 DIAGNOSIS — Z1389 Encounter for screening for other disorder: Secondary | ICD-10-CM

## 2015-05-21 DIAGNOSIS — Z3A16 16 weeks gestation of pregnancy: Secondary | ICD-10-CM

## 2015-05-21 DIAGNOSIS — Z3682 Encounter for antenatal screening for nuchal translucency: Secondary | ICD-10-CM

## 2015-05-21 DIAGNOSIS — Z331 Pregnant state, incidental: Secondary | ICD-10-CM

## 2015-05-21 DIAGNOSIS — Z363 Encounter for antenatal screening for malformations: Secondary | ICD-10-CM

## 2015-05-21 LAB — POCT URINALYSIS DIPSTICK
Glucose, UA: NEGATIVE
KETONES UA: NEGATIVE
Leukocytes, UA: NEGATIVE
Nitrite, UA: NEGATIVE

## 2015-05-21 NOTE — Progress Notes (Signed)
Low-risk OB appointment G1P0 [redacted]w[redacted]d Estimated Date of Delivery: 11/05/15 BP 100/60 mmHg  Pulse 72  Wt 124 lb (56.246 kg)  LMP 01/29/2015 (Approximate)  BP, weight, and urine reviewed.  Refer to obstetrical flow sheet for FH & FHR.  No fm yet. Denies cramping, lof, vb, or uti s/s. No complaints. Reports fob has never been tested for [facility]- no insurance- can come be tested at [facility]- oop cost 39.00 Reviewed warning s/s to report, fm. Plan:  Continue routine obstetrical care  F/U in 4wks for OB appointment and anatomy u/s 2nd IT today

## 2015-05-21 NOTE — Patient Instructions (Signed)

## 2015-05-23 LAB — MATERNAL SCREEN, INTEGRATED #2
ADSF: 0.49
AFP MoM: 1
Alpha-Fetoprotein: 35.7 ng/mL
CROWN RUMP LENGTH: 50.3 mm
DIA MOM: 1.11
DIA VALUE: 223.2 pg/mL
Estriol, Unconjugated: 0.41 ng/mL
Gest. Age on Collection Date: 11.7 weeks
Gestational Age: 15.7 weeks
MATERNAL AGE AT EDD: 25.6 a
NUCHAL TRANSLUCENCY (NT): 1.6 mm
NUMBER OF FETUSES: 1
Nuchal Translucency MoM: 1.17
PAPP-A MOM: 0.91
PAPP-A VALUE: 820.5 ng/mL
TEST RESULTS: NEGATIVE
WEIGHT: 122 [lb_av]
Weight: 124 [lb_av]
hCG MoM: 1.04
hCG Value: 44.6 IU/mL

## 2015-06-13 ENCOUNTER — Emergency Department (HOSPITAL_COMMUNITY)
Admission: EM | Admit: 2015-06-13 | Discharge: 2015-06-14 | Disposition: A | Discharge status: Home / Self Care | Payer: Medicaid Other | Attending: Emergency Medicine | Admitting: Emergency Medicine

## 2015-06-13 ENCOUNTER — Encounter (HOSPITAL_COMMUNITY): Payer: Self-pay

## 2015-06-13 DIAGNOSIS — O9989 Other specified diseases and conditions complicating pregnancy, childbirth and the puerperium: Secondary | ICD-10-CM | POA: Insufficient documentation

## 2015-06-13 DIAGNOSIS — S0990XA Unspecified injury of head, initial encounter: Secondary | ICD-10-CM | POA: Diagnosis not present

## 2015-06-13 DIAGNOSIS — Y998 Other external cause status: Secondary | ICD-10-CM | POA: Insufficient documentation

## 2015-06-13 DIAGNOSIS — R55 Syncope and collapse: Secondary | ICD-10-CM | POA: Insufficient documentation

## 2015-06-13 DIAGNOSIS — F17200 Nicotine dependence, unspecified, uncomplicated: Secondary | ICD-10-CM | POA: Insufficient documentation

## 2015-06-13 DIAGNOSIS — Y9289 Other specified places as the place of occurrence of the external cause: Secondary | ICD-10-CM | POA: Insufficient documentation

## 2015-06-13 DIAGNOSIS — R42 Dizziness and giddiness: Secondary | ICD-10-CM | POA: Diagnosis not present

## 2015-06-13 DIAGNOSIS — W01198A Fall on same level from slipping, tripping and stumbling with subsequent striking against other object, initial encounter: Secondary | ICD-10-CM | POA: Insufficient documentation

## 2015-06-13 DIAGNOSIS — Z3A18 18 weeks gestation of pregnancy: Secondary | ICD-10-CM | POA: Insufficient documentation

## 2015-06-13 DIAGNOSIS — Y9389 Activity, other specified: Secondary | ICD-10-CM | POA: Insufficient documentation

## 2015-06-13 DIAGNOSIS — Z3490 Encounter for supervision of normal pregnancy, unspecified, unspecified trimester: Secondary | ICD-10-CM

## 2015-06-13 DIAGNOSIS — Z79899 Other long term (current) drug therapy: Secondary | ICD-10-CM | POA: Diagnosis not present

## 2015-06-13 DIAGNOSIS — O99332 Smoking (tobacco) complicating pregnancy, second trimester: Secondary | ICD-10-CM | POA: Diagnosis not present

## 2015-06-13 DIAGNOSIS — H538 Other visual disturbances: Secondary | ICD-10-CM | POA: Diagnosis not present

## 2015-06-13 DIAGNOSIS — O9A212 Injury, poisoning and certain other consequences of external causes complicating pregnancy, second trimester: Secondary | ICD-10-CM | POA: Insufficient documentation

## 2015-06-13 HISTORY — DX: Encounter for supervision of normal pregnancy, unspecified, unspecified trimester: Z34.90

## 2015-06-13 LAB — URINALYSIS, ROUTINE W REFLEX MICROSCOPIC
BILIRUBIN URINE: NEGATIVE
GLUCOSE, UA: NEGATIVE mg/dL
NITRITE: NEGATIVE
PH: 6 (ref 5.0–8.0)
Protein, ur: NEGATIVE mg/dL
SPECIFIC GRAVITY, URINE: 1.015 (ref 1.005–1.030)

## 2015-06-13 LAB — CBC WITH DIFFERENTIAL/PLATELET
BASOS PCT: 0 %
Basophils Absolute: 0 10*3/uL (ref 0.0–0.1)
Eosinophils Absolute: 0.1 10*3/uL (ref 0.0–0.7)
Eosinophils Relative: 1 %
HCT: 35.8 % — ABNORMAL LOW (ref 36.0–46.0)
HEMOGLOBIN: 12.5 g/dL (ref 12.0–15.0)
Lymphocytes Relative: 15 %
Lymphs Abs: 2.8 10*3/uL (ref 0.7–4.0)
MCH: 30.2 pg (ref 26.0–34.0)
MCHC: 34.9 g/dL (ref 30.0–36.0)
MCV: 86.5 fL (ref 78.0–100.0)
MONO ABS: 0.7 10*3/uL (ref 0.1–1.0)
MONOS PCT: 4 %
NEUTROS ABS: 14.7 10*3/uL — AB (ref 1.7–7.7)
Neutrophils Relative %: 80 %
Platelets: 192 10*3/uL (ref 150–400)
RBC: 4.14 MIL/uL (ref 3.87–5.11)
RDW: 13.5 % (ref 11.5–15.5)
WBC: 18.3 10*3/uL — ABNORMAL HIGH (ref 4.0–10.5)

## 2015-06-13 LAB — URINE MICROSCOPIC-ADD ON

## 2015-06-13 LAB — BASIC METABOLIC PANEL
ANION GAP: 10 (ref 5–15)
BUN: <5 mg/dL — ABNORMAL LOW (ref 6–20)
CALCIUM: 9.4 mg/dL (ref 8.9–10.3)
CO2: 22 mmol/L (ref 22–32)
CREATININE: 0.46 mg/dL (ref 0.44–1.00)
Chloride: 105 mmol/L (ref 101–111)
GFR calc Af Amer: >60 mL/min (ref >60)
GFR calc non Af Amer: >60 mL/min (ref >60)
GLUCOSE: 91 mg/dL (ref 65–99)
Potassium: 3.1 mmol/L — ABNORMAL LOW (ref 3.5–5.1)
Sodium: 137 mmol/L (ref 135–145)

## 2015-06-13 NOTE — ED Notes (Signed)
Pt states she is [redacted] weeks pregnant,  was visiting with friends when she became dizzy and fell backwards, hitting her head on the floor.  Pt states she was feeling fine prior to incident.  Pt only c/o pain to back of her head

## 2015-06-13 NOTE — Discharge Instructions (Signed)
Return for any recurrent passing out. Return for severe headache or persistent vomiting. Workup here lab-wise without any significant findings.

## 2015-06-13 NOTE — ED Provider Notes (Addendum)
CSN: [id number]     Arrival date & time 06/13/15  2008 History  By signing my name below, I, [name], attest that this documentation has been prepared under the direction and in the presence of Fredia Sorrow, MD. Electronically Signed: [name], ED Scribe. 06/13/2015. 9:34 PM  Chief Complaint  Patient presents with  . Fall  . Loss of Consciousness   Patient is a 25 y.o. female presenting with syncope. The history is provided by the patient. No language interpreter was used.  Loss of Consciousness Episode history:  Single Most recent episode:  Today Duration: a few. Progression:  Resolved Chronicity:  New Context: normal activity   Context: not blood draw and not bowel movement   Witnessed: yes   Relieved by:  Nothing Worsened by:  Nothing tried Ineffective treatments:  None tried Associated symptoms: dizziness, headaches, nausea and visual change   Associated symptoms: no chest pain, no fever, no shortness of breath and no vomiting    Alyssa Owens is a 26 y.o.G1P0 female who is approximately [redacted] weeks pregnant presenting to the Emergency Department with chief complaint of a syncopal episode this evening. The pt states that prior to the episode she was dizzy and felt hot before she fell backwards to the hardwood floor. Associated symptoms include improving pain at the back of the head and nausea. Pt denies vaginal bleeding, dysuria, abdominal pain, vomiting, neck pain, and back pain. Fetal heart tones were noted to be 149 by nursing staff. Her OB care is at [facility] and she notes that she has had a normal [place].   Past Medical History  Diagnosis Date  . Medical history non-contributory   . Pregnancy    Past Surgical History  Procedure Laterality Date  . No past surgeries     Family History  Problem Relation Age of Onset  . Arthritis Mother   . Cancer Mother     breast  . Hypertension Mother   . Heart disease Father    Social History  Substance Use  Topics  . Smoking status: Current Every Day Smoker -- 0.25 packs/day  . Smokeless tobacco: Never Used  . Alcohol Use: No   OB History    Gravida Para Term Preterm AB TAB SAB Ectopic Multiple Living   1              Review of Systems  Constitutional: Negative for fever and chills.  HENT: Negative for rhinorrhea and sore throat.   Eyes: Positive for visual disturbance.  Respiratory: Negative for choking and shortness of breath.   Cardiovascular: Positive for syncope. Negative for chest pain and leg swelling.  Gastrointestinal: Positive for nausea. Negative for vomiting, abdominal pain and diarrhea.  Genitourinary: Negative for dysuria.  Musculoskeletal: Negative for back pain and neck pain.  Skin: Negative for rash.  Neurological: Positive for dizziness and headaches.  Hematological: Does not bruise/bleed easily.   Allergies  Review of patient's allergies indicates no known allergies.  Home Medications   Prior to Admission medications   Medication Sig Start Date End Date Taking? Authorizing Provider  prenatal vitamin w/FE, FA (PRENATAL 1 + 1) 27-1 MG TABS tablet Take 1 tablet by mouth daily.    Yes Historical Provider, MD  acetaminophen (TYLENOL) 325 MG tablet Take 650 mg by mouth every 6 (six) hours as needed for mild pain.     Historical Provider, MD  Alum & Mag Hydroxide-Simeth (MAGIC MOUTHWASH W/LIDOCAINE) SOLN Take 5 mLs by mouth 3 (three) times  daily as needed for mouth pain. Swish and spit, do not swallow Patient not taking: Reported on 04/08/2015 07/31/13   Tammy Triplett, PA-C  amoxicillin (AMOXIL) 500 MG capsule Take 1 capsule (500 mg total) by mouth 3 (three) times daily. Patient not taking: Reported on 04/08/2015 07/31/13   Tammy Triplett, PA-C  promethazine (PHENERGAN) 25 MG tablet Take 0.5-1 tablets (12.5-25 mg total) by mouth every 6 (six) hours as needed for nausea or vomiting. Patient not taking: Reported on 05/21/2015 04/08/15   Roma Schanz, CNM   BP 109/65  mmHg  Pulse 98  Temp(Src) 98 F (36.7 C) (Oral)  Resp 21  Ht 5\' 1"  (1.549 m)  Wt 123 lb (55.792 kg)  BMI 23.25 kg/m2  SpO2 100%  LMP 01/29/2015 (Approximate) Physical Exam  Constitutional: She is oriented to person, place, and time. She appears well-developed and well-nourished. No distress.  HENT:  Head: Normocephalic and atraumatic.  Mucous membranes moist  Eyes: EOM are normal. Pupils are equal, round, and reactive to light.  Neck: Normal range of motion.  Cardiovascular: Normal rate, regular rhythm and normal heart sounds.   Pulmonary/Chest: Effort normal and breath sounds normal.  RA saturation 100%  CTAB  Abdominal: Soft. Bowel sounds are normal. She exhibits no distension. There is no tenderness.  Uterus 1 cm below the umbilicus   Musculoskeletal: Normal range of motion. She exhibits no edema.  Neurological: She is alert and oriented to person, place, and time. No cranial nerve deficit. She exhibits normal muscle tone.  Skin: Skin is warm and dry.  Psychiatric: She has a normal mood and affect. Judgment normal.  Nursing note and vitals reviewed.   ED Course  Procedures (including critical care time) DIAGNOSTIC STUDIES: Oxygen Saturation is 100% on RA,  normal by my interpretation.    COORDINATION OF CARE: 9:23 PM Discussed treatment plan which includes lab work and EKG with pt at bedside and pt agreed to plan. Results for orders placed or performed during the hospital encounter of 06/13/15  Urinalysis, Routine w reflex microscopic (not at [facility])  Result Value Ref Range   Color, Urine YELLOW YELLOW   APPearance HAZY (A) CLEAR   Specific Gravity, Urine 1.015 1.005 - 1.030   pH 6.0 5.0 - 8.0   Glucose, UA NEGATIVE NEGATIVE mg/dL   Hgb urine dipstick TRACE (A) NEGATIVE   Bilirubin Urine NEGATIVE NEGATIVE   Ketones, ur TRACE (A) NEGATIVE mg/dL   Protein, ur NEGATIVE NEGATIVE mg/dL   Nitrite NEGATIVE NEGATIVE   Leukocytes, UA SMALL (A) NEGATIVE  Basic metabolic  panel  Result Value Ref Range   Sodium 137 135 - 145 mmol/L   Potassium 3.1 (L) 3.5 - 5.1 mmol/L   Chloride 105 101 - 111 mmol/L   CO2 22 22 - 32 mmol/L   Glucose, Bld 91 65 - 99 mg/dL   BUN <5 (L) 6 - 20 mg/dL   Creatinine, Ser 0.46 0.44 - 1.00 mg/dL   Calcium 9.4 8.9 - 10.3 mg/dL   GFR calc non Af Amer >60 >60 mL/min   GFR calc Af Amer >60 >60 mL/min   Anion gap 10 5 - 15  CBC with Differential/Platelet  Result Value Ref Range   WBC 18.3 (H) 4.0 - 10.5 K/uL   RBC 4.14 3.87 - 5.11 MIL/uL   Hemoglobin 12.5 12.0 - 15.0 g/dL   HCT 35.8 (L) 36.0 - 46.0 %   MCV 86.5 78.0 - 100.0 fL   MCH 30.2 26.0 - 34.0 pg  MCHC 34.9 30.0 - 36.0 g/dL   RDW 13.5 11.5 - 15.5 %   Platelets 192 150 - 400 K/uL   Neutrophils Relative % 80 %   Neutro Abs 14.7 (H) 1.7 - 7.7 K/uL   Lymphocytes Relative 15 %   Lymphs Abs 2.8 0.7 - 4.0 K/uL   Monocytes Relative 4 %   Monocytes Absolute 0.7 0.1 - 1.0 K/uL   Eosinophils Relative 1 %   Eosinophils Absolute 0.1 0.0 - 0.7 K/uL   Basophils Relative 0 %   Basophils Absolute 0.0 0.0 - 0.1 K/uL   Other  %    CORRECTIONS CALLED TO BORTZ C AT 2123 ON [id number] BY FORSYTH K  Urine microscopic-add on  Result Value Ref Range   Squamous Epithelial / LPF TOO NUMEROUS TO COUNT (A) NONE SEEN   WBC, UA 6-30 0 - 5 WBC/hpf   RBC / HPF 0-5 0 - 5 RBC/hpf   Bacteria, UA MANY (A) NONE SEEN   No results found.  I have personally reviewed and evaluated these lab results as part of my medical decision-making.   EKG Interpretation None     ED ECG REPORT   Date: 06/14/2015  Rate: 95  Rhythm: normal sinus rhythm  QRS Axis: normal  Intervals: normal  ST/T Wave abnormalities: normal  Conduction Disutrbances:none  Narrative Interpretation:   Old EKG Reviewed: none available  I have personally reviewed the EKG tracing and agree with the computerized printout as noted.  MDM   Final diagnoses:  Syncope, unspecified syncope type  Pregnancy    Patient fell from a  standing position she became dizzy lightheaded and fell backwards hitting her head. Brief period of loss of consciousness. No significant head pain neck pain no nausea or vomiting. Patient without a history of this felt fine earlier today and feels fine now. Workup without any significant findings. Urinalysis shows evidence of a contaminated urine not evidence urinary tract infection. Patient's fetal heart tones were in the 140s. Patient without any abdominal pain no vaginal bleeding.  I personally performed the services described in this documentation, which was scribed in my presence. The recorded information has been reviewed and is accurate.      Fredia Sorrow, MD 06/13/15 [id number]  Fredia Sorrow, MD 06/14/15 0001

## 2015-06-18 ENCOUNTER — Ambulatory Visit (INDEPENDENT_AMBULATORY_CARE_PROVIDER_SITE_OTHER): Payer: Medicaid Other | Admitting: Advanced Practice Midwife

## 2015-06-18 ENCOUNTER — Ambulatory Visit (INDEPENDENT_AMBULATORY_CARE_PROVIDER_SITE_OTHER): Payer: Medicaid Other

## 2015-06-18 ENCOUNTER — Encounter: Payer: Self-pay | Admitting: Advanced Practice Midwife

## 2015-06-18 VITALS — BP 100/62 | HR 57 | Wt 123.0 lb

## 2015-06-18 DIAGNOSIS — Z331 Pregnant state, incidental: Secondary | ICD-10-CM

## 2015-06-18 DIAGNOSIS — Z3A20 20 weeks gestation of pregnancy: Secondary | ICD-10-CM | POA: Diagnosis not present

## 2015-06-18 DIAGNOSIS — O3412 Maternal care for benign tumor of corpus uteri, second trimester: Secondary | ICD-10-CM | POA: Diagnosis not present

## 2015-06-18 DIAGNOSIS — Z3402 Encounter for supervision of normal first pregnancy, second trimester: Secondary | ICD-10-CM

## 2015-06-18 DIAGNOSIS — D252 Subserosal leiomyoma of uterus: Secondary | ICD-10-CM

## 2015-06-18 DIAGNOSIS — Z36 Encounter for antenatal screening of mother: Secondary | ICD-10-CM | POA: Diagnosis not present

## 2015-06-18 DIAGNOSIS — Z1389 Encounter for screening for other disorder: Secondary | ICD-10-CM

## 2015-06-18 DIAGNOSIS — Z363 Encounter for antenatal screening for malformations: Secondary | ICD-10-CM

## 2015-06-18 LAB — POCT URINALYSIS DIPSTICK
Blood, UA: NEGATIVE
GLUCOSE UA: NEGATIVE
KETONES UA: NEGATIVE
LEUKOCYTES UA: NEGATIVE
Nitrite, UA: NEGATIVE
Protein, UA: NEGATIVE

## 2015-06-18 MED ORDER — DOXYLAMINE-PYRIDOXINE 10-10 MG PO TBEC: DELAYED_RELEASE_TABLET | ORAL | Status: DC

## 2015-06-18 NOTE — Progress Notes (Signed)
G1P0 [redacted]w[redacted]d Estimated Date of Delivery: 11/05/15  Blood pressure 100/62, pulse 57, weight 123 lb (55.792 kg), last menstrual period 01/29/2015.   BP weight and urine results all reviewed and noted.  Please refer to the obstetrical flow sheet for the fundal height and fetal heart rate documentation: Anatomy US:  [place] 20wks,measurements c/w dates,normal ov's bilat,two fibroids (#1) post lus 6.2 x 5.3 x 6.2cm,(#2) post rt 6.5 x 4 x 6.3cm,post pl gr 0,cx 4.1 cm,svp ov fluid 5.7 cm,LVEICF 1.37mm,fhr 150 bpm,efw 342 g,anatomy complete, please have pt come back for f/u ultrasound at 28 wks per Dr. Elonda Husky.  Patient reports good fetal movement, denies any bleeding and no rupture of membranes symptoms or regular contractions. Patient is without complaints. Eats "all the time!" Still having to take phenergan for nausea.  Went to ED for syncopal episode a few days ago. Sounds like hypotension.  All questions were answered.  Orders Placed This Encounter  Procedures  . POCT urinalysis dipstick    Plan:  Continued routine obstetrical care, repeat US 28 weeks.  Rx diclegis (prior auth done) .  Cardiac consult if keeps fainting  Return in about 4 weeks (around 07/16/2015) for LROB.

## 2015-06-18 NOTE — Progress Notes (Signed)
Pt denies any problems or concerns at this time.  

## 2015-06-18 NOTE — Progress Notes (Signed)
[place] 20wks,measurements c/w dates,normal ov's bilat,two fibroids (#1) post lus 6.2 x 5.3 x 6.2cm,(#2) post rt 6.5 x 4 x 6.3cm,post pl gr 0,cx 4.1 cm,svp ov fluid 5.7 cm,LVEICF 1.33mm,fhr 150 bpm,efw 342 g,anatomy complete, please have pt come back for f/u ultrasound at 28 wks per Dr. Elonda Husky.

## 2015-07-16 ENCOUNTER — Encounter: Payer: Self-pay | Admitting: Obstetrics & Gynecology

## 2015-07-16 ENCOUNTER — Ambulatory Visit (INDEPENDENT_AMBULATORY_CARE_PROVIDER_SITE_OTHER): Payer: Medicaid Other | Admitting: Obstetrics & Gynecology

## 2015-07-16 VITALS — BP 100/60 | HR 76 | Wt 129.0 lb

## 2015-07-16 DIAGNOSIS — Z3402 Encounter for supervision of normal first pregnancy, second trimester: Secondary | ICD-10-CM

## 2015-07-16 DIAGNOSIS — O3412 Maternal care for benign tumor of corpus uteri, second trimester: Secondary | ICD-10-CM

## 2015-07-16 DIAGNOSIS — Z1389 Encounter for screening for other disorder: Secondary | ICD-10-CM

## 2015-07-16 DIAGNOSIS — D259 Leiomyoma of uterus, unspecified: Secondary | ICD-10-CM

## 2015-07-16 DIAGNOSIS — Z331 Pregnant state, incidental: Secondary | ICD-10-CM

## 2015-07-16 LAB — POCT URINALYSIS DIPSTICK
GLUCOSE UA: NEGATIVE
Ketones, UA: NEGATIVE
Nitrite, UA: NEGATIVE
Protein, UA: 1
RBC UA: NEGATIVE

## 2015-07-16 NOTE — Progress Notes (Signed)
G1P0 [redacted]w[redacted]d Estimated Date of Delivery: 11/05/15  Blood pressure 100/60, pulse 76, weight 129 lb (58.514 kg), last menstrual period 01/29/2015.   BP weight and urine results all reviewed and noted.  Please refer to the obstetrical flow sheet for the fundal height and fetal heart rate documentation:  Patient reports good fetal movement, denies any bleeding and no rupture of membranes symptoms or regular contractions. Patient is without complaints. All questions were answered.  Orders Placed This Encounter  Procedures  . US OB Follow Up  . POCT urinalysis dipstick    Plan:  Continued routine obstetrical care, repeat sonogram next visit along with PN2  Return in about 4 weeks (around 08/13/2015) for follow up sonogram, PN2, , LROB.

## 2015-08-13 ENCOUNTER — Ambulatory Visit (INDEPENDENT_AMBULATORY_CARE_PROVIDER_SITE_OTHER): Payer: Medicaid Other | Admitting: Women's Health

## 2015-08-13 ENCOUNTER — Ambulatory Visit (INDEPENDENT_AMBULATORY_CARE_PROVIDER_SITE_OTHER): Payer: Medicaid Other

## 2015-08-13 ENCOUNTER — Other Ambulatory Visit: Payer: Self-pay | Admitting: Obstetrics & Gynecology

## 2015-08-13 ENCOUNTER — Other Ambulatory Visit: Payer: Medicaid Other

## 2015-08-13 VITALS — BP 104/52 | HR 78 | Wt 139.0 lb

## 2015-08-13 DIAGNOSIS — D573 Sickle-cell trait: Secondary | ICD-10-CM

## 2015-08-13 DIAGNOSIS — Z3A28 28 weeks gestation of pregnancy: Secondary | ICD-10-CM

## 2015-08-13 DIAGNOSIS — O321XX1 Maternal care for breech presentation, fetus 1: Secondary | ICD-10-CM

## 2015-08-13 DIAGNOSIS — Z131 Encounter for screening for diabetes mellitus: Secondary | ICD-10-CM

## 2015-08-13 DIAGNOSIS — D259 Leiomyoma of uterus, unspecified: Secondary | ICD-10-CM

## 2015-08-13 DIAGNOSIS — O3412 Maternal care for benign tumor of corpus uteri, second trimester: Secondary | ICD-10-CM | POA: Diagnosis not present

## 2015-08-13 DIAGNOSIS — Z3403 Encounter for supervision of normal first pregnancy, third trimester: Secondary | ICD-10-CM

## 2015-08-13 DIAGNOSIS — F1290 Cannabis use, unspecified, uncomplicated: Secondary | ICD-10-CM

## 2015-08-13 DIAGNOSIS — O3413 Maternal care for benign tumor of corpus uteri, third trimester: Secondary | ICD-10-CM

## 2015-08-13 DIAGNOSIS — F1210 Cannabis abuse, uncomplicated: Secondary | ICD-10-CM

## 2015-08-13 DIAGNOSIS — Z331 Pregnant state, incidental: Secondary | ICD-10-CM

## 2015-08-13 DIAGNOSIS — O283 Abnormal ultrasonic finding on antenatal screening of mother: Secondary | ICD-10-CM

## 2015-08-13 DIAGNOSIS — D252 Subserosal leiomyoma of uterus: Secondary | ICD-10-CM

## 2015-08-13 DIAGNOSIS — Z369 Encounter for antenatal screening, unspecified: Secondary | ICD-10-CM

## 2015-08-13 DIAGNOSIS — O99323 Drug use complicating pregnancy, third trimester: Secondary | ICD-10-CM

## 2015-08-13 DIAGNOSIS — Z3402 Encounter for supervision of normal first pregnancy, second trimester: Secondary | ICD-10-CM | POA: Diagnosis not present

## 2015-08-13 DIAGNOSIS — Z1389 Encounter for screening for other disorder: Secondary | ICD-10-CM

## 2015-08-13 LAB — POCT URINALYSIS DIPSTICK
Glucose, UA: NEGATIVE
KETONES UA: NEGATIVE
Leukocytes, UA: NEGATIVE
Nitrite, UA: NEGATIVE
PROTEIN UA: NEGATIVE
RBC UA: NEGATIVE

## 2015-08-13 NOTE — Patient Instructions (Addendum)
[place] Pediatricians/Family Doctors:  [facility] [phone]                 [place] [phone] (usually not accepting new patients unless you have family there already, you are always welcome to call and ask)            [facility] ([place]) [phone]   [facility] Pediatricians/Family Doctors:   [place]: [phone]  [facility]: [phone]   Call the office [phone]) or go to [facility] if:  You begin to have strong, frequent contractions  Your water breaks.  Sometimes it is a big gush of fluid, sometimes it is just a trickle that keeps getting your panties wet or running down your legs  You have vaginal bleeding.  It is normal to have a small amount of spotting if your cervix was checked.   You don't feel your baby moving like normal.  If you don't, get you something to eat and drink and lay down and focus on feeling your baby move.  You should feel at least 10 movements in 2 hours.  If you don't, you should call the office or go to [facility].    Tdap Vaccine  It is recommended that you get the Tdap vaccine during the third trimester of EACH pregnancy to help protect your baby from getting pertussis (whooping cough)  27-36 weeks is the BEST time to do this so that you can pass the protection on to your baby. During pregnancy is better than after pregnancy, but if you are unable to get it during pregnancy it will be offered at the hospital.   You can get this vaccine at the health department or your family doctor  Everyone who will be around your baby should also be up-to-date on their vaccines. Adults (who are not pregnant) only need 1 dose of Tdap during adulthood.   Third Trimester of Pregnancy The third trimester is from week 29 through week 42, months 7 through 9. The third trimester is a time when the fetus  is growing rapidly. At the end of the ninth month, the fetus is about 20 inches in length and weighs 6-10 pounds.  BODY CHANGES Your body goes through many changes during pregnancy. The changes vary from woman to woman.  9. Your weight will continue to increase. You can expect to gain 25-35 pounds (11-16 kg) by the end of the pregnancy. 10. You may begin to get stretch marks on your hips, abdomen, and breasts. 11. You may urinate more often because the fetus is moving lower into your pelvis and pressing on your bladder. 12. You may develop or continue to have heartburn as a result of your pregnancy. 13. You may develop constipation because certain hormones are causing the muscles that push waste through your intestines to slow down. 14. You may develop hemorrhoids or swollen, bulging veins (varicose veins). 15. You may have pelvic pain because of the weight gain and pregnancy hormones relaxing your joints between the bones in your pelvis. Backaches may result from overexertion of the muscles supporting your posture. 16. You may have changes in your hair. These can include thickening of your hair, rapid growth, and changes in texture. Some women also have hair loss during or after pregnancy, or hair that feels dry or thin. Your hair will most likely return to normal after your  baby is born. 17. Your breasts will continue to grow and be tender. A yellow discharge may leak from your breasts called colostrum. 18. Your belly button may stick out. 77. You may feel short of breath because of your expanding uterus. 63. You may notice the fetus "dropping," or moving lower in your abdomen. 21. You may have a bloody mucus discharge. This usually occurs a few days to a week before labor begins. 39. Your cervix becomes thin and soft (effaced) near your due date. WHAT TO EXPECT AT YOUR PRENATAL EXAMS  You will have prenatal exams every 2 weeks until week 36. Then, you will have weekly prenatal exams. During a  routine prenatal visit: 3. You will be weighed to make sure you and the fetus are growing normally. 4. Your blood pressure is taken. 5. Your abdomen will be measured to track your baby's growth. 6. The fetal heartbeat will be listened to. 7. Any test results from the previous visit will be discussed. 8. You may have a cervical check near your due date to see if you have effaced. At around 36 weeks, your caregiver will check your cervix. At the same time, your caregiver will also perform a test on the secretions of the vaginal tissue. This test is to determine if a type of bacteria, Group B streptococcus, is present. Your caregiver will explain this further. Your caregiver may ask you: 2. What your birth plan is. 3. How you are feeling. 4. If you are feeling the baby move. 5. If you have had any abnormal symptoms, such as leaking fluid, bleeding, severe headaches, or abdominal cramping. 6. If you have any questions. Other tests or screenings that may be performed during your third trimester include: 2. Blood tests that check for low iron levels (anemia). 3. Fetal testing to check the health, activity level, and growth of the fetus. Testing is done if you have certain medical conditions or if there are problems during the pregnancy. FALSE LABOR You may feel small, irregular contractions that eventually go away. These are called Braxton Hicks contractions, or false labor. Contractions may last for hours, days, or even weeks before true labor sets in. If contractions come at regular intervals, intensify, or become painful, it is best to be seen by your caregiver.  SIGNS OF LABOR  3. Menstrual-like cramps. 4. Contractions that are 5 minutes apart or less. 5. Contractions that start on the top of the uterus and spread down to the lower abdomen and back. 6. A sense of increased pelvic pressure or back pain. 7. A watery or bloody mucus discharge that comes from the vagina. If you have any of these  signs before the 37th week of pregnancy, call your caregiver right away. You need to go to the hospital to get checked immediately. HOME CARE INSTRUCTIONS   Avoid all smoking, herbs, alcohol, and unprescribed drugs. These chemicals affect the formation and growth of the baby.  Follow your caregiver's instructions regarding medicine use. There are medicines that are either safe or unsafe to take during pregnancy.  Exercise only as directed by your caregiver. Experiencing uterine cramps is a good sign to stop exercising.  Continue to eat regular, healthy meals.  Wear a good support bra for breast tenderness.  Do not use hot tubs, steam rooms, or saunas.  Wear your seat belt at all times when driving.  Avoid raw meat, uncooked cheese, cat litter boxes, and soil used by cats. These carry germs that can cause birth defects  in the baby.  Take your prenatal vitamins.  Try taking a stool softener (if your caregiver approves) if you develop constipation. Eat more high-fiber foods, such as fresh vegetables or fruit and whole grains. Drink plenty of fluids to keep your urine clear or pale yellow.  Take warm sitz baths to soothe any pain or discomfort caused by hemorrhoids. Use hemorrhoid cream if your caregiver approves.  If you develop varicose veins, wear support hose. Elevate your feet for 15 minutes, 3-4 times a day. Limit salt in your diet.  Avoid heavy lifting, wear low heal shoes, and practice good posture.  Rest a lot with your legs elevated if you have leg cramps or low back pain.  Visit your dentist if you have not gone during your pregnancy. Use a soft toothbrush to brush your teeth and be gentle when you floss.  A sexual relationship may be continued unless your caregiver directs you otherwise.  Do not travel far distances unless it is absolutely necessary and only with the approval of your caregiver.  Take prenatal classes to understand, practice, and ask questions about the  labor and delivery.  Make a trial run to the hospital.  Pack your hospital bag.  Prepare the baby's nursery.  Continue to go to all your prenatal visits as directed by your caregiver. SEEK MEDICAL CARE IF:  You are unsure if you are in labor or if your water has broken.  You have dizziness.  You have mild pelvic cramps, pelvic pressure, or nagging pain in your abdominal area.  You have persistent nausea, vomiting, or diarrhea.  You have a bad smelling vaginal discharge.  You have pain with urination. SEEK IMMEDIATE MEDICAL CARE IF:   You have a fever.  You are leaking fluid from your vagina.  You have spotting or bleeding from your vagina.  You have severe abdominal cramping or pain.  You have rapid weight loss or gain.  You have shortness of breath with chest pain.  You notice sudden or extreme swelling of your face, hands, ankles, feet, or legs.  You have not felt your baby move in over an hour.  You have severe headaches that do not go away with medicine.  You have vision changes. Document Released: 05/19/2001 Document Revised: 05/30/2013 Document Reviewed: 07/26/2012 [facility] Patient Information 2015 [place], [place]. This information is not intended to replace advice given to you by your health care provider. Make sure you discuss any questions you have with your health care provider.

## 2015-08-13 NOTE — Progress Notes (Signed)
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

## 2015-08-13 NOTE — Progress Notes (Signed)
Low-risk OB appointment G1P0 [redacted]w[redacted]d Estimated Date of Delivery: 11/05/15 BP 104/52 mmHg  Pulse 78  Wt 139 lb (63.05 kg)  LMP 01/29/2015 (Approximate)  BP, weight, and urine reviewed.  Refer to obstetrical flow sheet for FH & FHR.  Reports good fm.  Denies regular uc's, lof, vb, or uti s/s. No complaints. Last used THC recently, advised no further THC, discussed potential long-term effects to infant. Has not discussed +[place] trait w/ FOB- no longer together, so he will not be tested. Will do urine cx today d/t [place] trait, now in 3rd trimester. Vomited before finished drinking glucola, thinks she tried to drink too fast.  Reviewed ptl s/s, fkc. Discussed today's u/s to f/u fibroids- LUS 6.3x6.2x6.4cm, Rt post 6.5x3.8x5.1cm- slightly larger. EICF still present, normal nt/it. Recommended Tdap at HD/PCP per CDC guidelines.  Plan:  Continue routine obstetrical care  F/U this week for pn2 (no visit)- drink glucola slowly, then 4wks for OB appointment and f/u u/s fibroids

## 2015-08-14 LAB — CBC
Hematocrit: 33.5 % — ABNORMAL LOW (ref 34.0–46.6)
Hemoglobin: 11.2 g/dL (ref 11.1–15.9)
MCH: 29.7 pg (ref 26.6–33.0)
MCHC: 33.4 g/dL (ref 31.5–35.7)
MCV: 89 fL (ref 79–97)
PLATELETS: 227 10*3/uL (ref 150–379)
RBC: 3.77 x10E6/uL (ref 3.77–5.28)
RDW: 13.1 % (ref 12.3–15.4)
WBC: 15.2 10*3/uL — ABNORMAL HIGH (ref 3.4–10.8)

## 2015-08-14 LAB — RPR: RPR: NONREACTIVE

## 2015-08-14 LAB — PMP SCREEN PROFILE (10S), URINE
AMPHETAMINE SCRN UR: NEGATIVE ng/mL
Barbiturate Screen, Ur: NEGATIVE ng/mL
Benzodiazepine Screen, Urine: NEGATIVE ng/mL
CANNABINOIDS UR QL SCN: POSITIVE ng/mL
Cocaine(Metab.)Screen, Urine: NEGATIVE ng/mL
Creatinine(Crt), U: 77.3 mg/dL (ref 20.0–300.0)
Methadone Scn, Ur: NEGATIVE ng/mL
OPIATE SCRN UR: NEGATIVE ng/mL
OXYCODONE+OXYMORPHONE UR QL SCN: NEGATIVE ng/mL
PCP SCRN UR: NEGATIVE ng/mL
PH UR, DRUG SCRN: 7.6 (ref 4.5–8.9)
Propoxyphene, Screen: NEGATIVE ng/mL

## 2015-08-14 LAB — HIV ANTIBODY (ROUTINE TESTING W REFLEX): HIV Screen 4th Generation wRfx: NONREACTIVE

## 2015-08-14 LAB — ANTIBODY SCREEN: Antibody Screen: NEGATIVE

## 2015-08-15 LAB — URINE CULTURE

## 2015-08-27 ENCOUNTER — Other Ambulatory Visit: Payer: Medicaid Other

## 2015-08-30 ENCOUNTER — Other Ambulatory Visit: Payer: Medicaid Other

## 2015-08-30 DIAGNOSIS — Z131 Encounter for screening for diabetes mellitus: Secondary | ICD-10-CM

## 2015-08-30 DIAGNOSIS — Z369 Encounter for antenatal screening, unspecified: Secondary | ICD-10-CM

## 2015-08-31 LAB — GLUCOSE TOLERANCE, 2 HOURS W/ 1HR
GLUCOSE, FASTING: 75 mg/dL (ref 65–91)
Glucose, 1 hour: 120 mg/dL (ref 65–179)
Glucose, 2 hour: 96 mg/dL (ref 65–152)

## 2015-09-10 ENCOUNTER — Encounter: Payer: Self-pay | Admitting: Women's Health

## 2015-09-10 ENCOUNTER — Ambulatory Visit (INDEPENDENT_AMBULATORY_CARE_PROVIDER_SITE_OTHER): Payer: Medicaid Other

## 2015-09-10 ENCOUNTER — Ambulatory Visit (INDEPENDENT_AMBULATORY_CARE_PROVIDER_SITE_OTHER): Payer: Medicaid Other | Admitting: Women's Health

## 2015-09-10 VITALS — BP 104/60 | HR 68 | Wt 138.0 lb

## 2015-09-10 DIAGNOSIS — Z3403 Encounter for supervision of normal first pregnancy, third trimester: Secondary | ICD-10-CM

## 2015-09-10 DIAGNOSIS — O3413 Maternal care for benign tumor of corpus uteri, third trimester: Secondary | ICD-10-CM

## 2015-09-10 DIAGNOSIS — D259 Leiomyoma of uterus, unspecified: Secondary | ICD-10-CM

## 2015-09-10 DIAGNOSIS — Z331 Pregnant state, incidental: Secondary | ICD-10-CM

## 2015-09-10 DIAGNOSIS — Z3A32 32 weeks gestation of pregnancy: Secondary | ICD-10-CM | POA: Diagnosis not present

## 2015-09-10 DIAGNOSIS — D252 Subserosal leiomyoma of uterus: Secondary | ICD-10-CM

## 2015-09-10 DIAGNOSIS — Z1389 Encounter for screening for other disorder: Secondary | ICD-10-CM

## 2015-09-10 LAB — POCT URINALYSIS DIPSTICK
Glucose, UA: NEGATIVE
KETONES UA: NEGATIVE
Nitrite, UA: NEGATIVE
PROTEIN UA: NEGATIVE
RBC UA: NEGATIVE

## 2015-09-10 NOTE — Progress Notes (Signed)
[place] 32 wks,cephalic,ant pl gr 1,normal ov's bilat,LUS fibroid is slightly larger 7.4 x 6.9 x 6 cm, post rt fibroid 4.2 x 4.3 x 3.4 cm ( smaller),afi 14.3cm,fhr 144 bpm,LVEICF N/C,EFW 1962 g 53%

## 2015-09-10 NOTE — Progress Notes (Signed)
Low-risk OB appointment G1P0 [redacted]w[redacted]d Estimated Date of Delivery: 11/05/15 BP 104/60 mmHg  Pulse 68  Wt 138 lb (62.596 kg)  LMP 01/29/2015 (Approximate)  BP, weight, and urine reviewed.  Refer to obstetrical flow sheet for FH & FHR.  Reports good fm.  Denies regular uc's, lof, vb, or uti s/s. No complaints. Reviewed today's u/s to f/u on fibroids- LUS slightly larger- now 7.4x6.9x6cm, Post Rt fibroid smaller, efw & afi normal. Discussed w/ LHE d/t concern for obstructing outlet during labor- always a concern but most baby's seem to find way out- no need for further u/s- will monitor w/ bimanual.  Plan:  Continue routine obstetrical care  F/U in 2wks for OB appointment

## 2015-09-16 ENCOUNTER — Ambulatory Visit (INDEPENDENT_AMBULATORY_CARE_PROVIDER_SITE_OTHER): Payer: Medicaid Other | Admitting: Women's Health

## 2015-09-16 ENCOUNTER — Encounter: Payer: Self-pay | Admitting: Women's Health

## 2015-09-16 VITALS — BP 110/80 | HR 84 | Wt 139.0 lb

## 2015-09-16 DIAGNOSIS — Z3403 Encounter for supervision of normal first pregnancy, third trimester: Secondary | ICD-10-CM

## 2015-09-16 DIAGNOSIS — Z3A33 33 weeks gestation of pregnancy: Secondary | ICD-10-CM

## 2015-09-16 DIAGNOSIS — R1909 Other intra-abdominal and pelvic swelling, mass and lump: Secondary | ICD-10-CM

## 2015-09-16 DIAGNOSIS — Z1389 Encounter for screening for other disorder: Secondary | ICD-10-CM

## 2015-09-16 DIAGNOSIS — Z331 Pregnant state, incidental: Secondary | ICD-10-CM

## 2015-09-16 DIAGNOSIS — O2243 Hemorrhoids in pregnancy, third trimester: Secondary | ICD-10-CM

## 2015-09-16 LAB — POCT URINALYSIS DIPSTICK
Blood, UA: NEGATIVE
Glucose, UA: NEGATIVE
KETONES UA: NEGATIVE
LEUKOCYTES UA: NEGATIVE
NITRITE UA: NEGATIVE
PROTEIN UA: NEGATIVE

## 2015-09-16 MED ORDER — HYDROCORTISONE 2.5 % RE CREA: 1.0000 "application " | TOPICAL_CREAM | Freq: Two times a day (BID) | RECTAL | Status: DC

## 2015-09-16 NOTE — Patient Instructions (Addendum)
Call the office [phone]) or go to [facility] if:  You begin to have strong, frequent contractions  Your water breaks.  Sometimes it is a big gush of fluid, sometimes it is just a trickle that keeps getting your panties wet or running down your legs  You have vaginal bleeding.  It is normal to have a small amount of spotting if your cervix was checked.   You don't feel your baby moving like normal.  If you don't, get you something to eat and drink and lay down and focus on feeling your baby move.  You should feel at least 10 movements in 2 hours.  If you don't, you should call the office or go to [facility].    Constipation  Drink plenty of fluid, preferably water, throughout the day  Eat foods high in fiber such as fruits, vegetables, and grains  Exercise, such as walking, is a good way to keep your bowels regular  Drink warm fluids, especially warm prune juice, or decaf coffee  Eat a 1/2 cup of real oatmeal (not instant), 1/2 cup applesauce, and 1/2-1 cup warm prune juice every day  If needed, you may take Colace (docusate sodium) stool softener once or twice a day to help keep the stool soft. If you are pregnant, wait until you are out of your first trimester (12-14 weeks of pregnancy)  If you still are having problems with constipation, you may take Miralax once daily as needed to help keep your bowels regular.  If you are pregnant, wait until you are out of your first trimester (12-14 weeks of pregnancy)    Hemorrhoids Hemorrhoids are swollen veins around the rectum or anus. There are two types of hemorrhoids:  12. Internal hemorrhoids. These occur in the veins just inside the rectum. They may poke through to the outside and become irritated and painful. 13. External hemorrhoids. These occur in the veins outside the anus and can be felt as a painful swelling or hard lump near the anus. CAUSES  Pregnancy.   Obesity.   Constipation or diarrhea.   Straining to  have a bowel movement.   Sitting for long periods on the toilet.  Heavy lifting or other activity that caused you to strain.  Anal intercourse. SYMPTOMS   Pain.   Anal itching or irritation.   Rectal bleeding.   Fecal leakage.   Anal swelling.   One or more lumps around the anus.  DIAGNOSIS  Your caregiver may be able to diagnose hemorrhoids by visual examination. Other examinations or tests that may be performed include:   Examination of the rectal area with a gloved hand (digital rectal exam).   Examination of anal canal using a small tube (scope).   A blood test if you have lost a significant amount of blood.  A test to look inside the colon (sigmoidoscopy or colonoscopy). TREATMENT Most hemorrhoids can be treated at home. However, if symptoms do not seem to be getting better or if you have a lot of rectal bleeding, your caregiver may perform a procedure to help make the hemorrhoids get smaller or remove them completely. Possible treatments include:   Placing a rubber band at the base of the hemorrhoid to cut off the circulation (rubber band ligation).   Injecting a chemical to shrink the hemorrhoid (sclerotherapy).   Using a tool to burn the hemorrhoid (infrared light therapy).   Surgically removing the hemorrhoid (hemorrhoidectomy).   Stapling the hemorrhoid to block blood flow to the  tissue (hemorrhoid stapling).  HOME CARE INSTRUCTIONS   Eat foods with fiber, such as whole grains, beans, nuts, fruits, and vegetables. Ask your doctor about taking products with added fiber in them (fibersupplements).  Increase fluid intake. Drink enough water and fluids to keep your urine clear or pale yellow.   Exercise regularly.   Go to the bathroom when you have the urge to have a bowel movement. Do not wait.   Avoid straining to have bowel movements.   Keep the anal area dry and clean. Use wet toilet paper or moist towelettes after a bowel movement.    Medicated creams and suppositories may be used or applied as directed.   Only take over-the-counter or prescription medicines as directed by your caregiver.   Take warm sitz baths for 15-20 minutes, 3-4 times a day to ease pain and discomfort.   Place ice packs on the hemorrhoids if they are tender and swollen. Using ice packs between sitz baths may be helpful.   Put ice in a plastic bag.   Place a towel between your skin and the bag.   Leave the ice on for 15-20 minutes, 3-4 times a day.   Do not use a donut-shaped pillow or sit on the toilet for long periods. This increases blood pooling and pain.  SEEK MEDICAL CARE IF:  You have increasing pain and swelling that is not controlled by treatment or medicine.  You have uncontrolled bleeding.  You have difficulty or you are unable to have a bowel movement.  You have pain or inflammation outside the area of the hemorrhoids. MAKE SURE YOU:  Understand these instructions.  Will watch your condition.  Will get help right away if you are not doing well or get worse.   This information is not intended to replace advice given to you by your health care provider. Make sure you discuss any questions you have with your health care provider.   Document Released: 05/22/2000 Document Revised: 05/11/2012 Document Reviewed: 03/29/2012 Elsevier Interactive Patient Education [employer].

## 2015-09-16 NOTE — Progress Notes (Signed)
[place] Low-risk OB appointment G1P0 [redacted]w[redacted]d Estimated Date of Delivery: 11/05/15 BP 110/80 mmHg  Pulse 84  Wt 139 lb (63.05 kg)  LMP 01/29/2015 (Approximate)  BP, weight, and urine reviewed.  Refer to obstetrical flow sheet for FH & FHR.  Reports good fm.  Denies regular uc's, lof, vb, or uti s/s.  Pain in bottom x 3 days, unsure if she may have hemorrhoids, has been using preparation H w/o relief. States she has been constipated/straining- not using anything otc for constipation. Also noticed lump in Lt groin yesterday- not painful.  Lt groin: ~2-3cm oblong mobile nontender mass under skin- possible lymph node vs. Cyst, no evidence of boil/redness/infection Rectal exam: 2 small soft non-thrombosed external hemorrhoids, tender to touch.  Reviewed ptl s/s, fkc. Rx anusol hc bid. Discussed sx thrombosed hemorrhoids- to let us know if worsening. Can try sitz baths/ice packs, etc- gave printed info. Gave printed info on constipation prevention/relief- to go ahead and start colace daily-bid. To let us know if lump Lt groin getting bigger/becoming infected- will reassess at next visit.  Plan:  Continue routine obstetrical care  F/U as scheduled for OB appointment

## 2015-09-24 ENCOUNTER — Ambulatory Visit (INDEPENDENT_AMBULATORY_CARE_PROVIDER_SITE_OTHER): Payer: Medicaid Other | Admitting: Obstetrics & Gynecology

## 2015-09-24 ENCOUNTER — Encounter: Payer: Self-pay | Admitting: Obstetrics & Gynecology

## 2015-09-24 VITALS — BP 104/70 | HR 67 | Wt 139.4 lb

## 2015-09-24 DIAGNOSIS — Z3403 Encounter for supervision of normal first pregnancy, third trimester: Secondary | ICD-10-CM

## 2015-09-24 DIAGNOSIS — Z331 Pregnant state, incidental: Secondary | ICD-10-CM

## 2015-09-24 DIAGNOSIS — Z3A34 34 weeks gestation of pregnancy: Secondary | ICD-10-CM

## 2015-09-24 DIAGNOSIS — O3413 Maternal care for benign tumor of corpus uteri, third trimester: Secondary | ICD-10-CM

## 2015-09-24 DIAGNOSIS — Z1389 Encounter for screening for other disorder: Secondary | ICD-10-CM

## 2015-09-24 DIAGNOSIS — D259 Leiomyoma of uterus, unspecified: Secondary | ICD-10-CM

## 2015-09-24 LAB — POCT URINALYSIS DIPSTICK
Blood, UA: NEGATIVE
Glucose, UA: NEGATIVE
Ketones, UA: NEGATIVE
LEUKOCYTES UA: NEGATIVE
Nitrite, UA: NEGATIVE
PROTEIN UA: NEGATIVE

## 2015-09-24 NOTE — Progress Notes (Signed)
Left groin area has resolved completely, hemorrhoids are still problematic  G1P0 [redacted]w[redacted]d Estimated Date of Delivery: 11/05/15  Blood pressure 104/70, pulse 67, weight 139 lb 6.4 oz (63.231 kg), last menstrual period 01/29/2015.   BP weight and urine results all reviewed and noted.  Please refer to the obstetrical flow sheet for the fundal height and fetal heart rate documentation:  Patient reports good fetal movement, denies any bleeding and no rupture of membranes symptoms or regular contractions. Patient is without complaints. All questions were answered.  Orders Placed This Encounter  Procedures  . POCT urinalysis dipstick    Plan:  Continued routine obstetrical care,  Continue to use topical local care for her hemorrhoids  No Follow-up on file.

## 2015-10-08 ENCOUNTER — Ambulatory Visit (INDEPENDENT_AMBULATORY_CARE_PROVIDER_SITE_OTHER): Payer: Medicaid Other | Admitting: Obstetrics & Gynecology

## 2015-10-08 ENCOUNTER — Ambulatory Visit (INDEPENDENT_AMBULATORY_CARE_PROVIDER_SITE_OTHER): Payer: Medicaid Other

## 2015-10-08 VITALS — BP 100/86 | HR 88 | Wt 140.0 lb

## 2015-10-08 DIAGNOSIS — Z331 Pregnant state, incidental: Secondary | ICD-10-CM

## 2015-10-08 DIAGNOSIS — O3413 Maternal care for benign tumor of corpus uteri, third trimester: Secondary | ICD-10-CM

## 2015-10-08 DIAGNOSIS — Z131 Encounter for screening for diabetes mellitus: Secondary | ICD-10-CM

## 2015-10-08 DIAGNOSIS — D259 Leiomyoma of uterus, unspecified: Secondary | ICD-10-CM | POA: Diagnosis not present

## 2015-10-08 DIAGNOSIS — R42 Dizziness and giddiness: Secondary | ICD-10-CM

## 2015-10-08 DIAGNOSIS — Z3403 Encounter for supervision of normal first pregnancy, third trimester: Secondary | ICD-10-CM

## 2015-10-08 LAB — GLUCOSE, POCT (MANUAL RESULT ENTRY): POC Glucose: 83 mg/dl (ref 70–99)

## 2015-10-08 NOTE — Progress Notes (Signed)
G1P0 [redacted]w[redacted]d Estimated Date of Delivery: 11/05/15  Blood pressure 100/86, pulse 88, weight 140 lb (63.504 kg), last menstrual period 01/29/2015.   BP weight and urine results all reviewed and noted.  Please refer to the obstetrical flow sheet for the fundal height and fetal heart rate documentation:  Patient reports good fetal movement, denies any bleeding and no rupture of membranes symptoms or regular contractions. Patient is without complaints. All questions were answered.  Orders Placed This Encounter  Procedures  . POCT glucose (manual entry)    Plan:  Continued routine obstetrical care, sonogram is normal  Had IVC compression during sonogram, resolved with fluids and getting on her side  No Follow-up on file.

## 2015-10-08 NOTE — Progress Notes (Signed)
[place] 36 wks,transverse head right,ant pl gr 2,normal ov's bilat,afi 21 cm,cx 3.5 cm,post LUS fibroid n/c,unable to see post rt fibroid,fhr 139 bpm,LVEICF N/C, EFW 2983 g 60%

## 2015-10-08 NOTE — Progress Notes (Signed)
Respirations 26, BS 83

## 2015-10-15 ENCOUNTER — Ambulatory Visit (INDEPENDENT_AMBULATORY_CARE_PROVIDER_SITE_OTHER): Payer: Medicaid Other | Admitting: Obstetrics & Gynecology

## 2015-10-15 VITALS — BP 120/72 | HR 74 | Wt 141.0 lb

## 2015-10-15 DIAGNOSIS — O321XX1 Maternal care for breech presentation, fetus 1: Secondary | ICD-10-CM

## 2015-10-15 DIAGNOSIS — Z3685 Encounter for antenatal screening for Streptococcus B: Secondary | ICD-10-CM

## 2015-10-15 DIAGNOSIS — D259 Leiomyoma of uterus, unspecified: Secondary | ICD-10-CM

## 2015-10-15 DIAGNOSIS — Z3403 Encounter for supervision of normal first pregnancy, third trimester: Secondary | ICD-10-CM

## 2015-10-15 DIAGNOSIS — O322XX1 Maternal care for transverse and oblique lie, fetus 1: Secondary | ICD-10-CM

## 2015-10-15 DIAGNOSIS — O3413 Maternal care for benign tumor of corpus uteri, third trimester: Secondary | ICD-10-CM

## 2015-10-15 DIAGNOSIS — Z369 Encounter for antenatal screening, unspecified: Secondary | ICD-10-CM

## 2015-10-15 NOTE — Progress Notes (Signed)
G1P0 [redacted]w[redacted]d Estimated Date of Delivery: 11/05/15  Blood pressure 120/72, pulse 74, weight 141 lb (63.957 kg), last menstrual period 01/29/2015.   BP weight and urine results all reviewed and noted.  Please refer to the obstetrical flow sheet for the fundal height and fetal heart rate documentation:  Patient reports good fetal movement, denies any bleeding and no rupture of membranes symptoms or regular contractions. Patient is without complaints. All questions were answered.  Orders Placed This Encounter  Procedures  . Strep Gp B NAA  . GC/Chlamydia Probe Amp    Plan:  Continued routine obstetrical care, obstructing fibroid causing baby to be LUQ oblique lie, the fibroid is in the lower segement on the left Discussed with patient and would not be an ppropriate candidatte for version. Pt aware  C section scheduled 10/29/2012  No Follow-up on file.

## 2015-10-16 LAB — GC/CHLAMYDIA PROBE AMP
Chlamydia trachomatis, NAA: NEGATIVE
Neisseria gonorrhoeae by PCR: NEGATIVE

## 2015-10-17 LAB — STREP GP B NAA: STREP GROUP B AG: POSITIVE — AB

## 2015-10-22 ENCOUNTER — Encounter: Payer: Self-pay | Admitting: Obstetrics & Gynecology

## 2015-10-22 ENCOUNTER — Ambulatory Visit (INDEPENDENT_AMBULATORY_CARE_PROVIDER_SITE_OTHER): Payer: Medicaid Other | Admitting: Obstetrics & Gynecology

## 2015-10-22 VITALS — BP 100/70 | HR 80 | Wt 142.0 lb

## 2015-10-22 DIAGNOSIS — Z331 Pregnant state, incidental: Secondary | ICD-10-CM

## 2015-10-22 DIAGNOSIS — O3413 Maternal care for benign tumor of corpus uteri, third trimester: Secondary | ICD-10-CM

## 2015-10-22 DIAGNOSIS — D259 Leiomyoma of uterus, unspecified: Secondary | ICD-10-CM

## 2015-10-22 DIAGNOSIS — Z3403 Encounter for supervision of normal first pregnancy, third trimester: Secondary | ICD-10-CM

## 2015-10-22 DIAGNOSIS — O321XX1 Maternal care for breech presentation, fetus 1: Secondary | ICD-10-CM

## 2015-10-22 DIAGNOSIS — Z1389 Encounter for screening for other disorder: Secondary | ICD-10-CM

## 2015-10-22 LAB — POCT URINALYSIS DIPSTICK
Glucose, UA: NEGATIVE
KETONES UA: NEGATIVE
LEUKOCYTES UA: NEGATIVE
Nitrite, UA: NEGATIVE
PROTEIN UA: NEGATIVE
RBC UA: NEGATIVE

## 2015-10-22 NOTE — Progress Notes (Signed)
Preoperative History and Physical  Alyssa Owens is a 25 y.o. G1P0 with Patient's last menstrual period was 01/29/2015 (approximate). admitted for a primary Caesarean section due to breech due to a posterior fibroid which obstructs the lower uterine segment making version attempt problematic.  Discussed the options with patient and will proceed with a primary Caesarean section.    PMH:    Past Medical History  Diagnosis Date  . Medical history non-contributory   . Pregnancy     PSH:     Past Surgical History  Procedure Laterality Date  . No past surgeries      POb/GynH:      OB History    Gravida Para Term Preterm AB TAB SAB Ectopic Multiple Living   1               SH:   Social History  Substance Use Topics  . Smoking status: Current Every Day Smoker -- 0.25 packs/day  . Smokeless tobacco: Never Used  . Alcohol Use: No    FH:    Family History  Problem Relation Age of Onset  . Arthritis Mother   . Cancer Mother     breast  . Hypertension Mother   . Heart disease Father      Allergies: No Known Allergies  Medications:       Current outpatient prescriptions:  .  prenatal vitamin w/FE, FA (PRENATAL 1 + 1) 27-1 MG TABS tablet, Take 1 tablet by mouth daily. , Disp: , Rfl:  .  hydrocortisone (ANUSOL-HC) 2.5 % rectal cream, Place 1 application rectally 2 (two) times daily. (Patient not taking: Reported on 10/18/2015), Disp: 30 g, Rfl: 0  Review of Systems:   Review of Systems  Constitutional: Negative for fever, chills, weight loss, malaise/fatigue and diaphoresis.  HENT: Negative for hearing loss, ear pain, nosebleeds, congestion, sore throat, neck pain, tinnitus and ear discharge.   Eyes: Negative for blurred vision, double vision, photophobia, pain, discharge and redness.  Respiratory: Negative for cough, hemoptysis, sputum production, shortness of breath, wheezing and stridor.   Cardiovascular: Negative for chest pain, palpitations, orthopnea, claudication,  leg swelling and PND.  Gastrointestinal: Positive for abdominal pain. Negative for heartburn, nausea, vomiting, diarrhea, constipation, blood in stool and melena.  Genitourinary: Negative for dysuria, urgency, frequency, hematuria and flank pain.  Musculoskeletal: Negative for myalgias, back pain, joint pain and falls.  Skin: Negative for itching and rash.  Neurological: Negative for dizziness, tingling, tremors, sensory change, speech change, focal weakness, seizures, loss of consciousness, weakness and headaches.  Endo/Heme/Allergies: Negative for environmental allergies and polydipsia. Does not bruise/bleed easily.  Psychiatric/Behavioral: Negative for depression, suicidal ideas, hallucinations, memory loss and substance abuse. The patient is not nervous/anxious and does not have insomnia.      PHYSICAL EXAM:  Blood pressure 100/70, pulse 80, weight 142 lb (64.411 kg), last menstrual period 01/29/2015.    Vitals reviewed. Constitutional: She is oriented to person, place, and time. She appears well-developed and well-nourished.  HENT:  Head: Normocephalic and atraumatic.  Right Ear: External ear normal.  Left Ear: External ear normal.  Nose: Nose normal.  Mouth/Throat: Oropharynx is clear and moist.  Eyes: Conjunctivae and EOM are normal. Pupils are equal, round, and reactive to light. Right eye exhibits no discharge. Left eye exhibits no discharge. No scleral icterus.  Neck: Normal range of motion. Neck supple. No tracheal deviation present. No thyromegaly present.  Cardiovascular: Normal rate, regular rhythm, normal heart sounds and intact distal pulses.  Exam  reveals no gallop and no friction rub.   No murmur heard. Respiratory: Effort normal and breath sounds normal. No respiratory distress. She has no wheezes. She has no rales. She exhibits no tenderness.  GI: Soft. Bowel sounds are normal. She exhibits no distension and no mass. There is tenderness. There is no rebound and no  guarding.  Genitourinary:       Vulva is normal without lesions Vagina is pink moist without discharge Cervix normal in appearance and pap is normal Uterus is size equals dates Adnexa is negative with normal sized ovaries by sonogram  Musculoskeletal: Normal range of motion. She exhibits no edema and no tenderness.  Neurological: She is alert and oriented to person, place, and time. She has normal reflexes. She displays normal reflexes. No cranial nerve deficit. She exhibits normal muscle tone. Coordination normal.  Skin: Skin is warm and dry. No rash noted. No erythema. No pallor.  Psychiatric: She has a normal mood and affect. Her behavior is normal. Judgment and thought content normal.    Labs: Results for orders placed or performed in visit on 10/22/15 (from the past 336 hour(s))  POCT urinalysis dipstick   Collection Time: 10/22/15 10:17 AM  Result Value Ref Range   Color, UA     Clarity, UA     Glucose, UA neg    Bilirubin, UA     Ketones, UA neg    Spec Grav, UA     Blood, UA neg    pH, UA     Protein, UA neg    Urobilinogen, UA     Nitrite, UA neg    Leukocytes, UA Negative Negative  Results for orders placed or performed in visit on 10/15/15 (from the past 336 hour(s))  Strep Gp B NAA   Collection Time: 10/15/15  2:00 PM  Result Value Ref Range   Strep Gp B NAA Positive (A) Negative  GC/Chlamydia Probe Amp   Collection Time: 10/15/15  2:00 PM  Result Value Ref Range   Chlamydia trachomatis, NAA Negative Negative   Neisseria gonorrhoeae by PCR Negative Negative  Results for orders placed or performed in visit on 10/08/15 (from the past 336 hour(s))  POCT glucose (manual entry)   Collection Time: 10/08/15 11:09 AM  Result Value Ref Range   POC Glucose 83 70 - 99 mg/dl    EKG: Orders placed or performed during the hospital encounter of 06/13/15  . EKG 12-Lead  . EKG 12-Lead  . EKG    Imaging Studies: US Ob Follow Up  10/08/2015  FOLLOW UP SONOGRAM Alyssa Owens is in the office for a follow up sonogram for EFW and to revaluate fibroids. She is a 25 y.o. year old G1P0 with Estimated Date of Delivery: 11/05/15 by LMP now at  [redacted]w[redacted]d weeks gestation. Thus far the pregnancy has been complicated by fibroids,smoker,sickle cell trait,LVEICF.Marland Kitchen GESTATION: SINGLETON PRESENTATION: Transverse head right FETAL ACTIVITY:          Heart rate         139          The fetus is active. AMNIOTIC FLUID: The amniotic fluid volume is  normal, 21 cm. PLACENTA LOCALIZATION:  anterior GRADE 2 CERVIX: Measures 3.5 cm ADNEXA: The ovaries are normal. GESTATIONAL AGE AND  BIOMETRICS: Gestational criteria: Estimated Date of Delivery: 11/05/15 by LMP now at [redacted]w[redacted]d Previous Scans:5          BIPARIETAL DIAMETER           8.7  cm         35+1 weeks HEAD CIRCUMFERENCE           32.85 cm         37+2 weeks ABDOMINAL CIRCUMFERENCE           32.91 cm         36+6 weeks FEMUR LENGTH           6.96 cm         35+5 weeks                                                       AVERAGE EGA(BY THIS SCAN):  36+2 weeks                                                 ESTIMATED FETAL WEIGHT:       2983  grams, 60 % ANATOMICAL SURVEY                                                                            COMMENTS CEREBRAL VENTRICLES yes normal  CHOROID PLEXUS yes normal  CEREBELLUM yes normal  CISTERNA MAGNA    NUCHAL REGION    ORBITS    NASAL BONE    NOSE/LIP yes normal  FACIAL PROFILE yes normal  4 CHAMBERED HEART yes abnormal LVEICF N/C OUTFLOW TRACTS yes normal  DIAPHRAGM yes normal  STOMACH yes normal  RENAL REGION yes normal  BLADDER yes normal  CORD INSERTION    3 VESSEL CORD yes normal  SPINE    ARMS/HANDS    LEGS/FEET    GENITALIA yes normal female     SUSPECTED ABNORMALITIES:  no QUALITY OF SCAN: satisfactory TECHNICIAN COMMENTS: [place] 36 wks,transverse head right,ant pl gr 2,normal ov's bilat,afi 21 cm,cx 3.5 cm,post LUS fibroid n/c,unable to see post rt fibroid,fhr 139 bpm,LVEICF N/C, EFW 2983 g 60% A copy of  this report including all images has been saved and backed up to a second source for retrieval if needed. All measures and details of the anatomical scan, placentation, fluid volume and pelvic anatomy are contained in that report. Amber Heide Guile 10/08/2015 11:07 AM Clinical Impression and recommendations: I have reviewed the sonogram results above, combined with the patient's current clinical course, below are my impressions and any appropriate recommendations for management based on the sonographic findings. 1.  G1P0 Estimated Date of Delivery: 11/05/15 by serial sonographic evaluations 2.  Fetal sonographic surveillance findings: a). Normal fluid volume b). Normal growth percentile with appropriate interval growth, 60% 3.  Normal general sonographic findings, fibroids are stable clinically insignificant, will not impair vaginal delivery Recommend continued prenatal evaluations and care based on this sonogram and as clinically indicated from the patient's clinical course. Florian Buff 10/08/2015 11:28 AM      Assessment: [redacted]w[redacted]d Estimated Date of Delivery: 11/05/15 Breech with obstructing lowr segment posterior fibroid  Patient Active Problem  List   Diagnosis Date Noted  . Hemorrhoids during pregnancy in third trimester, antepartum 09/16/2015  . Fetal echogenic intracardiac focus on prenatal ultrasound 08/13/2015  . Marijuana use 04/10/2015  . Sickle cell trait ([place]) 04/09/2015  . Supervision of normal first pregnancy 04/08/2015  . Uterine fibroids 04/08/2015  . Smoker 04/08/2015    Plan: Primary Caesarean section  Antonino Nienhuis H 10/22/2015 10:42 AM

## 2015-10-24 ENCOUNTER — Encounter (HOSPITAL_COMMUNITY): Payer: Self-pay

## 2015-10-29 ENCOUNTER — Encounter (HOSPITAL_COMMUNITY)
Admission: RE | Admit: 2015-10-29 | Discharge: 2015-10-29 | Disposition: A | Discharge status: Home / Self Care | Payer: Medicaid Other | Source: Ambulatory Visit | Attending: Obstetrics & Gynecology | Admitting: Obstetrics & Gynecology

## 2015-10-29 HISTORY — DX: Benign neoplasm of connective and other soft tissue, unspecified: D21.9

## 2015-10-29 LAB — CBC
HCT: 32 % — ABNORMAL LOW (ref 36.0–46.0)
HEMOGLOBIN: 10.7 g/dL — AB (ref 12.0–15.0)
MCH: 26.5 pg (ref 26.0–34.0)
MCHC: 33.4 g/dL (ref 30.0–36.0)
MCV: 79.2 fL (ref 78.0–100.0)
Platelets: 205 10*3/uL (ref 150–400)
RBC: 4.04 MIL/uL (ref 3.87–5.11)
RDW: 14 % (ref 11.5–15.5)
WBC: 12.8 10*3/uL — ABNORMAL HIGH (ref 4.0–10.5)

## 2015-10-29 LAB — TYPE AND SCREEN
ABO/RH(D): O POS
Antibody Screen: NEGATIVE

## 2015-10-29 LAB — ABO/RH: ABO/RH(D): O POS

## 2015-10-29 NOTE — Patient Instructions (Signed)
[place]  10/29/2015   Your procedure is scheduled on:  10/30/2015  Enter through the Main Entrance of [facility] at [place] up the phone at the desk and dial [phone].   Call this number if you have problems the morning of surgery: [phone]   Remember:   Do not eat food:After Midnight.  Do not drink clear liquids: After Midnight.  Take these medicines the morning of surgery with A SIP OF WATER: none   Do not wear jewelry, make-up or nail polish.  Do not wear lotions, powders, or perfumes. You may wear deodorant.  Do not shave 48 hours prior to surgery.  Do not bring valuables to the hospital.  [facility] is not   responsible for any belongings or valuables brought to the hospital.  Contacts, dentures or bridgework may not be worn into surgery.  Leave suitcase in the car. After surgery it may be brought to your room.  For patients admitted to the hospital, checkout time is 11:00 AM the day of              discharge.   Patients discharged the day of surgery will not be allowed to drive             home.  Name and phone number of your driver: na  Special Instructions:   Shower using CHG 2 nights before surgery and the night before surgery.  If you shower the day of surgery use CHG.  Use special wash - you have one bottle of CHG for all showers.  You should use approximately 1/3 of the bottle for each shower.   Please read over the following fact sheets that you were given:   Surgical Site Infection Prevention

## 2015-10-30 ENCOUNTER — Encounter (HOSPITAL_COMMUNITY): Payer: Self-pay | Admitting: Anesthesiology

## 2015-10-30 ENCOUNTER — Inpatient Hospital Stay (HOSPITAL_COMMUNITY): Payer: Medicaid Other | Admitting: Anesthesiology

## 2015-10-30 ENCOUNTER — Inpatient Hospital Stay (HOSPITAL_COMMUNITY)
Admission: RE | Admit: 2015-10-30 | Discharge: 2015-11-02 | DRG: 765 | Disposition: A | Discharge status: Home / Self Care | Payer: Medicaid Other | Source: Ambulatory Visit | Attending: Obstetrics & Gynecology | Admitting: Obstetrics & Gynecology

## 2015-10-30 ENCOUNTER — Encounter (HOSPITAL_COMMUNITY)
Admission: RE | Disposition: A | Discharge status: Home / Self Care | Payer: Self-pay | Source: Ambulatory Visit | Attending: Obstetrics & Gynecology

## 2015-10-30 DIAGNOSIS — Z87891 Personal history of nicotine dependence: Secondary | ICD-10-CM

## 2015-10-30 DIAGNOSIS — O9081 Anemia of the puerperium: Secondary | ICD-10-CM | POA: Diagnosis present

## 2015-10-30 DIAGNOSIS — Z98891 History of uterine scar from previous surgery: Secondary | ICD-10-CM

## 2015-10-30 DIAGNOSIS — Z8261 Family history of arthritis: Secondary | ICD-10-CM | POA: Diagnosis not present

## 2015-10-30 DIAGNOSIS — O321XX0 Maternal care for breech presentation, not applicable or unspecified: Principal | ICD-10-CM | POA: Diagnosis present

## 2015-10-30 DIAGNOSIS — Z8249 Family history of ischemic heart disease and other diseases of the circulatory system: Secondary | ICD-10-CM | POA: Diagnosis not present

## 2015-10-30 DIAGNOSIS — O2243 Hemorrhoids in pregnancy, third trimester: Secondary | ICD-10-CM | POA: Diagnosis present

## 2015-10-30 DIAGNOSIS — D573 Sickle-cell trait: Secondary | ICD-10-CM | POA: Diagnosis present

## 2015-10-30 DIAGNOSIS — O3413 Maternal care for benign tumor of corpus uteri, third trimester: Secondary | ICD-10-CM | POA: Diagnosis present

## 2015-10-30 DIAGNOSIS — D252 Subserosal leiomyoma of uterus: Secondary | ICD-10-CM | POA: Diagnosis present

## 2015-10-30 DIAGNOSIS — D62 Acute posthemorrhagic anemia: Secondary | ICD-10-CM | POA: Diagnosis present

## 2015-10-30 DIAGNOSIS — Z3A39 39 weeks gestation of pregnancy: Secondary | ICD-10-CM

## 2015-10-30 LAB — COMPREHENSIVE METABOLIC PANEL
ALT: 10 U/L — ABNORMAL LOW (ref 14–54)
AST: 25 U/L (ref 15–41)
Albumin: 3.2 g/dL — ABNORMAL LOW (ref 3.5–5.0)
Alkaline Phosphatase: 167 U/L — ABNORMAL HIGH (ref 38–126)
Anion gap: 9 (ref 5–15)
BUN: <5 mg/dL — ABNORMAL LOW (ref 6–20)
CHLORIDE: 106 mmol/L (ref 101–111)
CO2: 23 mmol/L (ref 22–32)
Calcium: 9.3 mg/dL (ref 8.9–10.3)
Creatinine, Ser: 0.63 mg/dL (ref 0.44–1.00)
Glucose, Bld: 81 mg/dL (ref 65–99)
POTASSIUM: 3.8 mmol/L (ref 3.5–5.1)
SODIUM: 138 mmol/L (ref 135–145)
TOTAL PROTEIN: 7.5 g/dL (ref 6.5–8.1)
Total Bilirubin: 1.2 mg/dL (ref 0.3–1.2)

## 2015-10-30 LAB — RPR: RPR: NONREACTIVE

## 2015-10-30 SURGERY — Surgical Case
Anesthesia: Spinal

## 2015-10-30 MED ORDER — MENTHOL 3 MG MT LOZG: 1.0000 | LOZENGE | OROMUCOSAL | Status: DC | PRN

## 2015-10-30 MED ORDER — SIMETHICONE 80 MG PO CHEW: 80.0000 mg | CHEWABLE_TABLET | ORAL | Status: DC | PRN

## 2015-10-30 MED ORDER — IBUPROFEN 600 MG PO TABS
600.0000 mg | ORAL_TABLET | Freq: Four times a day (QID) | ORAL | Status: DC
  Administered 2015-10-30 – 2015-11-02 (×11): 600 mg via ORAL
  Filled 2015-10-30 (×11): qty 1

## 2015-10-30 MED ORDER — DIPHENHYDRAMINE HCL 25 MG PO CAPS
25.0000 mg | ORAL_CAPSULE | ORAL | Status: DC | PRN
  Filled 2015-10-30: qty 1

## 2015-10-30 MED ORDER — PHENYLEPHRINE 40 MCG/ML (10ML) SYRINGE FOR IV PUSH (FOR BLOOD PRESSURE SUPPORT)
PREFILLED_SYRINGE | INTRAVENOUS | Status: AC
Start: 2015-10-30 — End: 2015-10-30
  Filled 2015-10-30: qty 20

## 2015-10-30 MED ORDER — OXYCODONE HCL 5 MG PO TABS
10.0000 mg | ORAL_TABLET | ORAL | Status: DC | PRN
  Administered 2015-10-31 – 2015-11-01 (×4): 10 mg via ORAL
  Filled 2015-10-30 (×5): qty 2

## 2015-10-30 MED ORDER — HYDROMORPHONE HCL 1 MG/ML IJ SOLN: 0.2500 mg | INTRAMUSCULAR | Status: DC | PRN

## 2015-10-30 MED ORDER — FENTANYL CITRATE (PF) 100 MCG/2ML IJ SOLN
INTRAMUSCULAR | Status: DC | PRN
  Administered 2015-10-30: 20 ug via INTRATHECAL

## 2015-10-30 MED ORDER — PHENYLEPHRINE HCL 10 MG/ML IJ SOLN
INTRAMUSCULAR | Status: DC | PRN
  Administered 2015-10-30: 120 ug via INTRAVENOUS
  Administered 2015-10-30: 80 ug via INTRAVENOUS
  Administered 2015-10-30: 40 ug via INTRAVENOUS

## 2015-10-30 MED ORDER — EPHEDRINE 5 MG/ML INJ
INTRAVENOUS | Status: AC
  Filled 2015-10-30: qty 10

## 2015-10-30 MED ORDER — SCOPOLAMINE 1 MG/3DAYS TD PT72
1.0000 | MEDICATED_PATCH | Freq: Once | TRANSDERMAL | Status: DC
Start: 2015-10-30 — End: 2015-11-02
  Administered 2015-10-30: 1.5 mg via TRANSDERMAL

## 2015-10-30 MED ORDER — LACTATED RINGERS IV SOLN
INTRAVENOUS | Status: DC
  Administered 2015-10-30 (×3): via INTRAVENOUS

## 2015-10-30 MED ORDER — OXYCODONE HCL 5 MG PO TABS
5.0000 mg | ORAL_TABLET | ORAL | Status: DC | PRN
  Administered 2015-10-30 – 2015-11-02 (×4): 5 mg via ORAL
  Filled 2015-10-30 (×3): qty 1

## 2015-10-30 MED ORDER — DIPHENHYDRAMINE HCL 25 MG PO CAPS: 25.0000 mg | ORAL_CAPSULE | Freq: Four times a day (QID) | ORAL | Status: DC | PRN

## 2015-10-30 MED ORDER — SENNOSIDES-DOCUSATE SODIUM 8.6-50 MG PO TABS
2.0000 | ORAL_TABLET | ORAL | Status: DC
  Administered 2015-10-30 – 2015-11-01 (×3): 2 via ORAL
  Filled 2015-10-30 (×3): qty 2

## 2015-10-30 MED ORDER — MORPHINE SULFATE (PF) 0.5 MG/ML IJ SOLN
INTRAMUSCULAR | Status: DC | PRN
  Administered 2015-10-30: .2 mg via INTRATHECAL

## 2015-10-30 MED ORDER — KETOROLAC TROMETHAMINE 30 MG/ML IJ SOLN
INTRAMUSCULAR | Status: AC
  Filled 2015-10-30: qty 1

## 2015-10-30 MED ORDER — WITCH HAZEL-GLYCERIN EX PADS: 1.0000 "application " | MEDICATED_PAD | CUTANEOUS | Status: DC | PRN

## 2015-10-30 MED ORDER — PNEUMOCOCCAL VAC POLYVALENT 25 MCG/0.5ML IJ INJ
0.5000 mL | INJECTION | INTRAMUSCULAR | Status: DC
  Filled 2015-10-30: qty 0.5

## 2015-10-30 MED ORDER — ZOLPIDEM TARTRATE 5 MG PO TABS: 5.0000 mg | ORAL_TABLET | Freq: Every evening | ORAL | Status: DC | PRN

## 2015-10-30 MED ORDER — LACTATED RINGERS IV SOLN: Freq: Once | INTRAVENOUS | Status: DC

## 2015-10-30 MED ORDER — PHENYLEPHRINE 8 MG IN D5W 100 ML (0.08MG/ML) PREMIX OPTIME
INJECTION | INTRAVENOUS | Status: DC | PRN
  Administered 2015-10-30: 60 ug/min via INTRAVENOUS

## 2015-10-30 MED ORDER — LACTATED RINGERS IV SOLN
INTRAVENOUS | Status: DC
Start: 2015-10-30 — End: 2015-11-02
  Administered 2015-10-30 – 2015-10-31 (×2): via INTRAVENOUS

## 2015-10-30 MED ORDER — KETOROLAC TROMETHAMINE 30 MG/ML IJ SOLN: 30.0000 mg | Freq: Four times a day (QID) | INTRAMUSCULAR | Status: AC | PRN

## 2015-10-30 MED ORDER — PRENATAL MULTIVITAMIN CH
1.0000 | ORAL_TABLET | Freq: Every day | ORAL | Status: DC
  Administered 2015-11-01 – 2015-11-02 (×2): 1 via ORAL
  Filled 2015-10-30 (×2): qty 1

## 2015-10-30 MED ORDER — KETOROLAC TROMETHAMINE 30 MG/ML IJ SOLN: 30.0000 mg | Freq: Once | INTRAMUSCULAR | Status: DC

## 2015-10-30 MED ORDER — PHENYLEPHRINE 8 MG IN D5W 100 ML (0.08MG/ML) PREMIX OPTIME
INJECTION | INTRAVENOUS | Status: AC
  Filled 2015-10-30: qty 100

## 2015-10-30 MED ORDER — DIPHENHYDRAMINE HCL 50 MG/ML IJ SOLN: 12.5000 mg | INTRAMUSCULAR | Status: DC | PRN

## 2015-10-30 MED ORDER — KETOROLAC TROMETHAMINE 30 MG/ML IJ SOLN
30.0000 mg | Freq: Four times a day (QID) | INTRAMUSCULAR | Status: AC | PRN
  Administered 2015-10-30: 30 mg via INTRAMUSCULAR

## 2015-10-30 MED ORDER — EPHEDRINE SULFATE 50 MG/ML IJ SOLN
INTRAMUSCULAR | Status: DC | PRN
  Administered 2015-10-30: 5 mg via INTRAVENOUS

## 2015-10-30 MED ORDER — ACETAMINOPHEN 500 MG PO TABS
1000.0000 mg | ORAL_TABLET | Freq: Four times a day (QID) | ORAL | Status: AC
  Administered 2015-10-30 – 2015-10-31 (×2): 1000 mg via ORAL
  Filled 2015-10-30 (×2): qty 2

## 2015-10-30 MED ORDER — SIMETHICONE 80 MG PO CHEW
80.0000 mg | CHEWABLE_TABLET | ORAL | Status: DC
  Administered 2015-10-30 – 2015-11-01 (×3): 80 mg via ORAL
  Filled 2015-10-30 (×3): qty 1

## 2015-10-30 MED ORDER — MORPHINE SULFATE (PF) 0.5 MG/ML IJ SOLN
INTRAMUSCULAR | Status: AC
  Filled 2015-10-30: qty 10

## 2015-10-30 MED ORDER — SODIUM CHLORIDE 0.9 % IR SOLN
Status: DC | PRN
  Administered 2015-10-30: 1000 mL

## 2015-10-30 MED ORDER — NALBUPHINE HCL 10 MG/ML IJ SOLN: 5.0000 mg | Freq: Once | INTRAMUSCULAR | Status: DC | PRN

## 2015-10-30 MED ORDER — MEPERIDINE HCL 25 MG/ML IJ SOLN: 6.2500 mg | INTRAMUSCULAR | Status: DC | PRN

## 2015-10-30 MED ORDER — ONDANSETRON HCL 4 MG/2ML IJ SOLN: 4.0000 mg | Freq: Once | INTRAMUSCULAR | Status: DC | PRN

## 2015-10-30 MED ORDER — TETANUS-DIPHTH-ACELL PERTUSSIS 5-2.5-18.5 LF-MCG/0.5 IM SUSP
0.5000 mL | Freq: Once | INTRAMUSCULAR | Status: AC
  Administered 2015-11-02: 0.5 mL via INTRAMUSCULAR
  Filled 2015-10-30: qty 0.5

## 2015-10-30 MED ORDER — FENTANYL CITRATE (PF) 100 MCG/2ML IJ SOLN
INTRAMUSCULAR | Status: AC
  Filled 2015-10-30: qty 2

## 2015-10-30 MED ORDER — NALOXONE HCL 2 MG/2ML IJ SOSY
1.0000 ug/kg/h | PREFILLED_SYRINGE | INTRAVENOUS | Status: DC | PRN
  Filled 2015-10-30: qty 2

## 2015-10-30 MED ORDER — ONDANSETRON HCL 4 MG/2ML IJ SOLN: 4.0000 mg | Freq: Three times a day (TID) | INTRAMUSCULAR | Status: DC | PRN

## 2015-10-30 MED ORDER — NALOXONE HCL 0.4 MG/ML IJ SOLN: 0.4000 mg | INTRAMUSCULAR | Status: DC | PRN

## 2015-10-30 MED ORDER — SCOPOLAMINE 1 MG/3DAYS TD PT72
MEDICATED_PATCH | TRANSDERMAL | Status: AC
  Administered 2015-10-30: 1.5 mg via TRANSDERMAL
  Filled 2015-10-30: qty 1

## 2015-10-30 MED ORDER — SODIUM CHLORIDE 0.9% FLUSH: 3.0000 mL | INTRAVENOUS | Status: DC | PRN

## 2015-10-30 MED ORDER — OXYTOCIN 10 UNIT/ML IJ SOLN
40.0000 [IU] | INTRAVENOUS | Status: DC | PRN
  Administered 2015-10-30: 40 [IU] via INTRAVENOUS

## 2015-10-30 MED ORDER — DIBUCAINE 1 % RE OINT: 1.0000 "application " | TOPICAL_OINTMENT | RECTAL | Status: DC | PRN

## 2015-10-30 MED ORDER — NALBUPHINE HCL 10 MG/ML IJ SOLN: 5.0000 mg | INTRAMUSCULAR | Status: DC | PRN

## 2015-10-30 MED ORDER — SCOPOLAMINE 1 MG/3DAYS TD PT72: 1.0000 | MEDICATED_PATCH | Freq: Once | TRANSDERMAL | Status: DC

## 2015-10-30 MED ORDER — OXYTOCIN 10 UNIT/ML IJ SOLN
INTRAMUSCULAR | Status: AC
  Filled 2015-10-30: qty 4

## 2015-10-30 MED ORDER — ONDANSETRON HCL 4 MG/2ML IJ SOLN
INTRAMUSCULAR | Status: DC | PRN
  Administered 2015-10-30: 4 mg via INTRAVENOUS

## 2015-10-30 MED ORDER — COCONUT OIL OIL
1.0000 | TOPICAL_OIL | Status: DC | PRN
Start: 2015-10-30 — End: 2015-11-02
  Filled 2015-10-30: qty 120

## 2015-10-30 MED ORDER — CEFAZOLIN SODIUM-DEXTROSE 2-4 GM/100ML-% IV SOLN
2.0000 g | INTRAVENOUS | Status: AC
  Administered 2015-10-30: 2 g via INTRAVENOUS

## 2015-10-30 MED ORDER — SIMETHICONE 80 MG PO CHEW
80.0000 mg | CHEWABLE_TABLET | Freq: Three times a day (TID) | ORAL | Status: DC
  Administered 2015-10-31 – 2015-11-02 (×6): 80 mg via ORAL
  Filled 2015-10-30 (×6): qty 1

## 2015-10-30 MED ORDER — ONDANSETRON HCL 4 MG/2ML IJ SOLN
INTRAMUSCULAR | Status: AC
  Filled 2015-10-30: qty 2

## 2015-10-30 MED ORDER — IBUPROFEN 600 MG PO TABS: 600.0000 mg | ORAL_TABLET | Freq: Four times a day (QID) | ORAL | Status: DC | PRN

## 2015-10-30 MED ORDER — BUPIVACAINE IN DEXTROSE 0.75-8.25 % IT SOLN
INTRATHECAL | Status: DC | PRN
  Administered 2015-10-30: 1.4 mg via INTRATHECAL

## 2015-10-30 MED ORDER — ACETAMINOPHEN 325 MG PO TABS
650.0000 mg | ORAL_TABLET | ORAL | Status: DC | PRN
  Administered 2015-10-31: 650 mg via ORAL
  Filled 2015-10-30: qty 2

## 2015-10-30 MED ORDER — OXYTOCIN 40 UNITS IN LACTATED RINGERS INFUSION - SIMPLE MED: 2.5000 [IU]/h | INTRAVENOUS | Status: AC

## 2015-10-30 MED ORDER — CEFAZOLIN SODIUM-DEXTROSE 2-4 GM/100ML-% IV SOLN
INTRAVENOUS | Status: AC
  Filled 2015-10-30: qty 100

## 2015-10-30 SURGICAL SUPPLY — 38 items
CLAMP CORD UMBIL (MISCELLANEOUS) IMPLANT
CLOTH BEACON ORANGE TIMEOUT ST (SAFETY) ×3 IMPLANT
DRSG OPSITE POSTOP 4X10 (GAUZE/BANDAGES/DRESSINGS) ×3 IMPLANT
DURAPREP 26ML APPLICATOR (WOUND CARE) ×6 IMPLANT
ELECT REM PT RETURN 9FT ADLT (ELECTROSURGICAL) ×3
ELECTRODE REM PT RTRN 9FT ADLT (ELECTROSURGICAL) ×1 IMPLANT
EXTRACTOR VACUUM BELL STYLE (SUCTIONS) IMPLANT
GLOVE BIOGEL PI IND STRL 7.0 (GLOVE) ×1 IMPLANT
GLOVE BIOGEL PI IND STRL 8 (GLOVE) ×1 IMPLANT
GLOVE BIOGEL PI INDICATOR 7.0 (GLOVE) ×2
GLOVE BIOGEL PI INDICATOR 8 (GLOVE) ×2
GLOVE ECLIPSE 8.0 STRL XLNG CF (GLOVE) ×3 IMPLANT
GOWN STRL REUS W/TWL LRG LVL3 (GOWN DISPOSABLE) ×6 IMPLANT
KIT ABG SYR 3ML LUER SLIP (SYRINGE) ×3 IMPLANT
LIQUID BAND (GAUZE/BANDAGES/DRESSINGS) ×3 IMPLANT
NEEDLE HYPO 18GX1.5 BLUNT FILL (NEEDLE) ×3 IMPLANT
NEEDLE HYPO 22GX1.5 SAFETY (NEEDLE) ×3 IMPLANT
NEEDLE HYPO 25X5/8 SAFETYGLIDE (NEEDLE) ×3 IMPLANT
NS IRRIG 1000ML POUR BTL (IV SOLUTION) ×3 IMPLANT
PACK C SECTION WH (CUSTOM PROCEDURE TRAY) ×3 IMPLANT
PAD ABD DERMACEA PRESS 5X9 (GAUZE/BANDAGES/DRESSINGS) ×3 IMPLANT
PAD OB MATERNITY 4.3X12.25 (PERSONAL CARE ITEMS) ×3 IMPLANT
PENCIL BUTTON HOLSTER BLD 10FT (ELECTRODE) ×3 IMPLANT
PENCIL SMOKE EVAC W/HOLSTER (ELECTROSURGICAL) ×3 IMPLANT
RTRCTR C-SECT PINK 25CM LRG (MISCELLANEOUS) IMPLANT
SPONGE GAUZE 4X4 12PLY (GAUZE/BANDAGES/DRESSINGS) ×3 IMPLANT
SUT CHROMIC 0 CT 1 (SUTURE) ×3 IMPLANT
SUT MNCRL 0 VIOLET CTX 36 (SUTURE) ×2 IMPLANT
SUT MONOCRYL 0 CTX 36 (SUTURE) ×4
SUT PLAIN 2 0 (SUTURE)
SUT PLAIN 2 0 XLH (SUTURE) IMPLANT
SUT PLAIN ABS 2-0 CT1 27XMFL (SUTURE) IMPLANT
SUT VIC AB 0 CTX 36 (SUTURE) ×2
SUT VIC AB 0 CTX36XBRD ANBCTRL (SUTURE) ×1 IMPLANT
SUT VIC AB 4-0 KS 27 (SUTURE) IMPLANT
SYR 20CC LL (SYRINGE) ×6 IMPLANT
TOWEL OR 17X24 6PK STRL BLUE (TOWEL DISPOSABLE) ×3 IMPLANT
TRAY FOLEY CATH SILVER 14FR (SET/KITS/TRAYS/PACK) IMPLANT

## 2015-10-30 NOTE — Anesthesia Postprocedure Evaluation (Signed)
Anesthesia Post Note  Patient: AMEI PIROZZI  Procedure(s) Performed: Procedure(s) (LRB): CESAREAN SECTION (N/A)  Patient location during evaluation: [facility] Anesthesia Type: Spinal Level of consciousness: awake and alert and oriented Pain management: pain level controlled Vital Signs Assessment: post-procedure vital signs reviewed and stable Respiratory status: spontaneous breathing, nonlabored ventilation, patient connected to nasal cannula oxygen and respiratory function stable Cardiovascular status: stable Postop Assessment: no headache, no backache, spinal receding, no signs of nausea or vomiting, adequate PO intake and patient able to bend at knees Anesthetic complications: no     Last Vitals:  Filed Vitals:   10/30/15 1430 10/30/15 1530  BP: 114/53   Pulse: 64   Temp: 36.7 C 36.9 C  Resp: 18 16    Last Pain:  Filed Vitals:   10/30/15 1612  PainSc: 2    Pain Goal: Patients Stated Pain Goal: 0 (10/30/15 1130)               Willa Rough

## 2015-10-30 NOTE — Transfer of Care (Signed)
Immediate Anesthesia Transfer of Care Note  Patient: Alyssa Owens  Procedure(s) Performed: Procedure(s): CESAREAN SECTION (N/A)  Patient Location: PACU  Anesthesia Type:Spinal  Level of Consciousness: awake  Airway & Oxygen Therapy: Patient Spontanous Breathing  Post-op Assessment: Report given to RN  Post vital signs: Reviewed and stable  Last Vitals:  Filed Vitals:   10/30/15 0750  BP: 107/69  Pulse: 77  Temp: 36.8 C  Resp: 20    Last Pain: There were no vitals filed for this visit.    Patients Stated Pain Goal: 5 ([id number])  Complications: No apparent anesthesia complications

## 2015-10-30 NOTE — Anesthesia Preprocedure Evaluation (Signed)
Anesthesia Evaluation  Patient identified by MRN, date of birth, ID band Patient awake    Reviewed: Allergy & Precautions, H&P , NPO status , Patient's Chart, lab work & pertinent test results  Airway Mallampati: I  TM Distance: >3 FB Neck ROM: full    Dental no notable dental hx.    Pulmonary former smoker,    Pulmonary exam normal        Cardiovascular negative cardio ROS Normal cardiovascular exam     Neuro/Psych negative neurological ROS  negative psych ROS   GI/Hepatic negative GI ROS, Neg liver ROS,   Endo/Other  negative endocrine ROS  Renal/GU negative Renal ROS     Musculoskeletal   Abdominal Normal abdominal exam  (+)   Peds  Hematology negative hematology ROS (+)   Anesthesia Other Findings   Reproductive/Obstetrics (+) Pregnancy                             Anesthesia Physical Anesthesia Plan  ASA: II  Anesthesia Plan: Spinal   Post-op Pain Management:    Induction:   Airway Management Planned:   Additional Equipment:   Intra-op Plan:   Post-operative Plan:   Informed Consent: I have reviewed the patients History and Physical, chart, labs and discussed the procedure including the risks, benefits and alternatives for the proposed anesthesia with the patient or authorized representative who has indicated his/her understanding and acceptance.     Plan Discussed with: CRNA and Surgeon  Anesthesia Plan Comments:         Anesthesia Quick Evaluation

## 2015-10-30 NOTE — Progress Notes (Signed)
  Prenatal Transfer Tool  Maternal Diabetes: No Genetic Screening: Normal Maternal Ultrasounds/Referrals: Normal Fetal Ultrasounds or other Referrals:  None Maternal Substance Abuse:  No Significant Maternal Medications:  None Significant Maternal Lab Results: Lab values include: Other: +Sickle Cell trait, FOB unknown

## 2015-10-30 NOTE — Anesthesia Postprocedure Evaluation (Signed)
Anesthesia Post Note  Patient: Alyssa Owens  Procedure(s) Performed: Procedure(s) (LRB): CESAREAN SECTION (N/A)  Patient location during evaluation: PACU Anesthesia Type: Spinal Level of consciousness: awake Pain management: pain level controlled Vital Signs Assessment: post-procedure vital signs reviewed and stable Respiratory status: spontaneous breathing Cardiovascular status: stable Postop Assessment: no headache, no backache, spinal receding, patient able to bend at knees and no signs of nausea or vomiting Anesthetic complications: no     Last Vitals:  Filed Vitals:   10/30/15 1201 10/30/15 1215  BP: 117/70 126/76  Pulse: 67 65  Temp:  36.7 C  Resp: 18 18    Last Pain: There were no vitals filed for this visit. Pain Goal: Patients Stated Pain Goal: 0 (10/30/15 1130)               Jerrico Covello JR,JOHN Mateo Flow

## 2015-10-30 NOTE — H&P (Signed)
Preoperative History and Physical  Alyssa Owens is a 25 y.o. G1P0 with [redacted]w[redacted]d  Estimated Date of Delivery: 11/05/15  admitted for a primary Caesarean section.  Baby is breech due to an obstructing fibroid posteriorly in the lower uterine segment making a version attempt inappropriate.  PMH:    Past Medical History  Diagnosis Date  . Medical history non-contributory   . Pregnancy   . Fibroid     PSH:     Past Surgical History  Procedure Laterality Date  . No past surgeries      POb/GynH:      OB History    Gravida Para Term Preterm AB TAB SAB Ectopic Multiple Living   1               SH:   Social History  Substance Use Topics  . Smoking status: Former Smoker -- 0.25 packs/day    Quit date: 10/01/2015  . Smokeless tobacco: Never Used  . Alcohol Use: No    FH:    Family History  Problem Relation Age of Onset  . Arthritis Mother   . Cancer Mother     breast  . Hypertension Mother   . Heart disease Father      Allergies: No Known Allergies  Medications:       Current facility-administered medications:  .  ceFAZolin (ANCEF) 2-4 GM/100ML-% IVPB, , , ,  .  ceFAZolin (ANCEF) IVPB 2g/100 mL premix, 2 g, Intravenous, On Call to OR, Florian Buff, MD .  lactated ringers infusion, , Intravenous, Continuous, Lyn Hollingshead, MD, Last Rate: 125 mL/hr at 10/30/15 0815 .  lactated ringers infusion, , Intravenous, Once, Lyn Hollingshead, MD .  scopolamine (TRANSDERM-SCOP) 1 MG/3DAYS 1.5 mg, 1 patch, Transdermal, Once, Lyn Hollingshead, MD, 1.5 mg at 10/30/15 0815  Review of Systems:   Review of Systems  Constitutional: Negative for fever, chills, weight loss, malaise/fatigue and diaphoresis.  HENT: Negative for hearing loss, ear pain, nosebleeds, congestion, sore throat, neck pain, tinnitus and ear discharge.   Eyes: Negative for blurred vision, double vision, photophobia, pain, discharge and redness.  Respiratory: Negative for cough, hemoptysis, sputum  production, shortness of breath, wheezing and stridor.   Cardiovascular: Negative for chest pain, palpitations, orthopnea, claudication, leg swelling and PND.  Gastrointestinal: Positive for abdominal pain. Negative for heartburn, nausea, vomiting, diarrhea, constipation, blood in stool and melena.  Genitourinary: Negative for dysuria, urgency, frequency, hematuria and flank pain.  Musculoskeletal: Negative for myalgias, back pain, joint pain and falls.  Skin: Negative for itching and rash.  Neurological: Negative for dizziness, tingling, tremors, sensory change, speech change, focal weakness, seizures, loss of consciousness, weakness and headaches.  Endo/Heme/Allergies: Negative for environmental allergies and polydipsia. Does not bruise/bleed easily.  Psychiatric/Behavioral: Negative for depression, suicidal ideas, hallucinations, memory loss and substance abuse. The patient is not nervous/anxious and does not have insomnia.      PHYSICAL EXAM:  Blood pressure 107/69, pulse 77, temperature 98.2 F (36.8 C), temperature source Oral, resp. rate 20, last menstrual period 01/29/2015, SpO2 100 %.    Vitals reviewed. Constitutional: She is oriented to person, place, and time. She appears well-developed and well-nourished.  HENT:  Head: Normocephalic and atraumatic.  Right Ear: External ear normal.  Left Ear: External ear normal.  Nose: Nose normal.  Mouth/Throat: Oropharynx is clear and moist.  Eyes: Conjunctivae and EOM are normal. Pupils are equal, round, and reactive to light. Right eye exhibits no discharge. Left eye exhibits no discharge. No scleral  icterus.  Neck: Normal range of motion. Neck supple. No tracheal deviation present. No thyromegaly present.  Cardiovascular: Normal rate, regular rhythm, normal heart sounds and intact distal pulses.  Exam reveals no gallop and no friction rub.   No murmur heard. Respiratory: Effort normal and breath sounds normal. No respiratory distress.  She has no wheezes. She has no rales. She exhibits no tenderness.  GI: Soft. Bowel sounds are normal. She exhibits no distension and no mass. There is tenderness. There is no rebound and no guarding.  Genitourinary:       Vulva is normal without lesions Vagina is pink moist without discharge Cervix normal in appearance and pap is normal Uterus is 39 weeks size Adnexa is negative with normal sized ovaries by sonogram  Musculoskeletal: Normal range of motion. She exhibits no edema and no tenderness.  Neurological: She is alert and oriented to person, place, and time. She has normal reflexes. She displays normal reflexes. No cranial nerve deficit. She exhibits normal muscle tone. Coordination normal.  Skin: Skin is warm and dry. No rash noted. No erythema. No pallor.  Psychiatric: She has a normal mood and affect. Her behavior is normal. Judgment and thought content normal.    Labs: Results for orders placed or performed during the hospital encounter of 10/30/15 (from the past 336 hour(s))  Comprehensive metabolic panel   Collection Time: 10/30/15  8:03 AM  Result Value Ref Range   Sodium 138 135 - 145 mmol/L   Potassium 3.8 3.5 - 5.1 mmol/L   Chloride 106 101 - 111 mmol/L   CO2 23 22 - 32 mmol/L   Glucose, Bld 81 65 - 99 mg/dL   BUN <5 (L) 6 - 20 mg/dL   Creatinine, Ser 0.63 0.44 - 1.00 mg/dL   Calcium 9.3 8.9 - 10.3 mg/dL   Total Protein 7.5 6.5 - 8.1 g/dL   Albumin 3.2 (L) 3.5 - 5.0 g/dL   AST 25 15 - 41 U/L   ALT 10 (L) 14 - 54 U/L   Alkaline Phosphatase 167 (H) 38 - 126 U/L   Total Bilirubin 1.2 0.3 - 1.2 mg/dL   GFR calc non Af Amer >60 >60 mL/min   GFR calc Af Amer >60 >60 mL/min   Anion gap 9 5 - 15  Results for orders placed or performed during the hospital encounter of 10/29/15 (from the past 336 hour(s))  RPR   Collection Time: 10/29/15 11:40 AM  Result Value Ref Range   RPR Ser Ql Non Reactive Non Reactive  CBC   Collection Time: 10/29/15 11:40 AM  Result Value Ref  Range   WBC 12.8 (H) 4.0 - 10.5 K/uL   RBC 4.04 3.87 - 5.11 MIL/uL   Hemoglobin 10.7 (L) 12.0 - 15.0 g/dL   HCT 32.0 (L) 36.0 - 46.0 %   MCV 79.2 78.0 - 100.0 fL   MCH 26.5 26.0 - 34.0 pg   MCHC 33.4 30.0 - 36.0 g/dL   RDW 14.0 11.5 - 15.5 %   Platelets 205 150 - 400 K/uL  Type and screen   Collection Time: 10/29/15 11:40 AM  Result Value Ref Range   ABO/RH(D) O POS    Antibody Screen NEG    Sample Expiration 11/01/2015   ABO/Rh   Collection Time: 10/29/15 11:40 AM  Result Value Ref Range   ABO/RH(D) O POS   Results for orders placed or performed in visit on 10/22/15 (from the past 336 hour(s))  POCT urinalysis dipstick  Collection Time: 10/22/15 10:17 AM  Result Value Ref Range   Color, UA     Clarity, UA     Glucose, UA neg    Bilirubin, UA     Ketones, UA neg    Spec Grav, UA     Blood, UA neg    pH, UA     Protein, UA neg    Urobilinogen, UA     Nitrite, UA neg    Leukocytes, UA Negative Negative    EKG: Orders placed or performed during the hospital encounter of 06/13/15  . EKG 12-Lead  . EKG 12-Lead  . EKG    Imaging Studies: US Ob Follow Up  10/08/2015  FOLLOW UP SONOGRAM Alyssa Owens is in the office for a follow up sonogram for EFW and to revaluate fibroids. She is a 25 y.o. year old G1P0 with Estimated Date of Delivery: 11/05/15 by LMP now at  [redacted]w[redacted]d weeks gestation. Thus far the pregnancy has been complicated by fibroids,smoker,sickle cell trait,LVEICF.Marland Kitchen GESTATION: SINGLETON PRESENTATION: Transverse head right FETAL ACTIVITY:          Heart rate         139          The fetus is active. AMNIOTIC FLUID: The amniotic fluid volume is  normal, 21 cm. PLACENTA LOCALIZATION:  anterior GRADE 2 CERVIX: Measures 3.5 cm ADNEXA: The ovaries are normal. GESTATIONAL AGE AND  BIOMETRICS: Gestational criteria: Estimated Date of Delivery: 11/05/15 by LMP now at [redacted]w[redacted]d Previous Scans:5          BIPARIETAL DIAMETER           8.7 cm         35+1 weeks HEAD CIRCUMFERENCE            32.85 cm         37+2 weeks ABDOMINAL CIRCUMFERENCE           32.91 cm         36+6 weeks FEMUR LENGTH           6.96 cm         35+5 weeks                                                       AVERAGE EGA(BY THIS SCAN):  36+2 weeks                                                 ESTIMATED FETAL WEIGHT:       2983  grams, 60 % ANATOMICAL SURVEY                                                                            COMMENTS CEREBRAL VENTRICLES yes normal  CHOROID PLEXUS yes normal  CEREBELLUM yes normal  CISTERNA MAGNA    NUCHAL REGION    ORBITS    NASAL BONE    NOSE/LIP  yes normal  FACIAL PROFILE yes normal  4 CHAMBERED HEART yes abnormal LVEICF N/C OUTFLOW TRACTS yes normal  DIAPHRAGM yes normal  STOMACH yes normal  RENAL REGION yes normal  BLADDER yes normal  CORD INSERTION    3 VESSEL CORD yes normal  SPINE    ARMS/HANDS    LEGS/FEET    GENITALIA yes normal female     SUSPECTED ABNORMALITIES:  no QUALITY OF SCAN: satisfactory TECHNICIAN COMMENTS: [place] 36 wks,transverse head right,ant pl gr 2,normal ov's bilat,afi 21 cm,cx 3.5 cm,post LUS fibroid n/c,unable to see post rt fibroid,fhr 139 bpm,LVEICF N/C, EFW 2983 g 60% A copy of this report including all images has been saved and backed up to a second source for retrieval if needed. All measures and details of the anatomical scan, placentation, fluid volume and pelvic anatomy are contained in that report. Amber Heide Guile 10/08/2015 11:07 AM Clinical Impression and recommendations: I have reviewed the sonogram results above, combined with the patient's current clinical course, below are my impressions and any appropriate recommendations for management based on the sonographic findings. 1.  G1P0 Estimated Date of Delivery: 11/05/15 by serial sonographic evaluations 2.  Fetal sonographic surveillance findings: a). Normal fluid volume b). Normal growth percentile with appropriate interval growth, 60% 3.  Normal general sonographic findings, fibroids are stable clinically  insignificant, will not impair vaginal delivery Recommend continued prenatal evaluations and care based on this sonogram and as clinically indicated from the patient's clinical course. Florian Buff 10/08/2015 11:28 AM      Assessment: [redacted]w[redacted]d Estimated Date of Delivery: 11/05/15 Breech presentation secondary to an obstructing lower uterine segment fibroid Patient Active Problem List   Diagnosis Date Noted  . Hemorrhoids during pregnancy in third trimester, antepartum 09/16/2015  . Fetal echogenic intracardiac focus on prenatal ultrasound 08/13/2015  . Marijuana use 04/10/2015  . Sickle cell trait ([place]) 04/09/2015  . Supervision of normal first pregnancy 04/08/2015  . Uterine fibroids 04/08/2015  . Smoker 04/08/2015    Plan: Primary Caesarean section  EURE,LUTHER H 10/30/2015 9:22 AM

## 2015-10-30 NOTE — Op Note (Signed)
Cesarean Section Operative Report  UARDA COBOS  10/30/2015  Indications: breech presentation, obstructing posterior uterine fibroid  Pre-operative Diagnosis: PRIMARY CESAREAN SECTION FOR BREECH.   Post-operative Diagnosis: Same   Surgeon: Surgeon(s) and Role:    * Florian Buff, MD - Primary    * Gwynne Edinger, MD - Fellow   Attending Attestation: I was present and scrubbed for the entire procedure.   Assistants: none  Anesthesia: spinal    Estimated Blood Loss: 650 ml  Total IV Fluids: 2200 ml LR  Urine Output:: 125 ml clear yellow urine  Specimens: none  Findings: Viable female infant in breech presentation; Apgars 8/9; weight 3270 g; arterial cord pH 7.259; clear amniotic fluid; intact placenta with three vessel cord; normal uterus, fallopian tubes and ovaries bilaterally. Numerous posterior subserosal fibroids noted, including one large posterior inferior fibroid.  Baby condition / location:  Couplet care / Skin to Skin   Complications: no complications  Indications: RENALDA KAMINER is a 25 y.o. G1P1000 with an IUP [redacted]w[redacted]d presenting for primary c/s secondary to breech presentation with large obstructing fibroid.  The risks, benefits, complications, treatment options, and expected outcomes were discussed with the patient . The patient concurred with the proposed plan, giving informed consent. identified as ANDRIANA SANTONI and the procedure verified as C-Section Delivery.  Procedure Details:  The patient was taken back to the operative suite where spinal anesthesia was placed.  A time out was held and the above information confirmed.   After induction of anesthesia, the patient was draped and prepped in the usual sterile manner and placed in a dorsal supine position with a leftward tilt. A Pfannenstiel incision was made and carried down through the subcutaneous tissue to the fascia. Fascial incision was made and bluntly extended transversely. The fascia was  separated from the underlying rectus tissue superiorly and inferiorly. The peritoneum was identified and bluntly entered and extended longitudinally.Bladder blade placed. A low transverse uterine incision was made and extended bluntly. Delivered from cephalic presentation was a viable infant with Apgars and weight as above. The umbilical cord was clamped and cut cord blood was obtained for evaluation. Cord ph was sent. The placenta was removed Intact and appeared normal. The uterine outline, tubes and ovaries appeared normal. Multiple large posterior subserosal fibroids noted. The uterine incision was closed with running locked sutures of 0 monocryl with an imbricating layer of the same.   Hemostasis was observed. The rectus muscles were reapproximated with two interrupted sutures of 2-0 chromic gut. The rectus muscles were examined and hemostasis observed. The fascia was then reapproximated with running sutures of 0Vicryl. The skin was closed with 4-0Vicryl.   Instrument, sponge, and needle counts were correct prior the abdominal closure and were correct at the conclusion of the case.     Disposition: PACU - hemodynamically stable.   Maternal Condition: stable       Signed: Patsy Lager WoukMD 10/30/2015 10:30 AM

## 2015-10-30 NOTE — Anesthesia Procedure Notes (Signed)
Spinal Patient location during procedure: OR Start time: 10/30/2015 9:30 AM End time: 10/30/2015 9:34 AM Staffing Anesthesiologist: Lyn Hollingshead Performed by: anesthesiologist  Preanesthetic Checklist Completed: patient identified, surgical consent, pre-op evaluation, timeout performed, IV checked, risks and benefits discussed and monitors and equipment checked Spinal Block Patient position: sitting Prep: site prepped and draped and DuraPrep Patient monitoring: heart rate, cardiac monitor, continuous pulse ox and blood pressure Approach: midline Location: L3-4 Injection technique: single-shot Needle Needle type: Sprotte  Needle gauge: 24 G Needle length: 9 cm Needle insertion depth: 5 cm Assessment Sensory level: T4

## 2015-10-30 NOTE — Addendum Note (Signed)
Addendum  created 10/30/15 1838 by Jonna Munro, CRNA   Modules edited: Clinical Notes   Clinical Notes:  File: [id number]

## 2015-10-31 ENCOUNTER — Encounter (HOSPITAL_COMMUNITY): Payer: Self-pay | Admitting: Obstetrics & Gynecology

## 2015-10-31 LAB — CBC
HEMATOCRIT: 25.1 % — AB (ref 36.0–46.0)
HEMOGLOBIN: 8.4 g/dL — AB (ref 12.0–15.0)
MCH: 26.3 pg (ref 26.0–34.0)
MCHC: 33.5 g/dL (ref 30.0–36.0)
MCV: 78.4 fL (ref 78.0–100.0)
Platelets: 168 10*3/uL (ref 150–400)
RBC: 3.2 MIL/uL — ABNORMAL LOW (ref 3.87–5.11)
RDW: 14.2 % (ref 11.5–15.5)
WBC: 12.8 10*3/uL — ABNORMAL HIGH (ref 4.0–10.5)

## 2015-10-31 MED ORDER — FERROUS SULFATE 325 (65 FE) MG PO TABS
325.0000 mg | ORAL_TABLET | Freq: Two times a day (BID) | ORAL | Status: DC
  Administered 2015-10-31 – 2015-11-02 (×4): 325 mg via ORAL
  Filled 2015-10-31 (×4): qty 1

## 2015-10-31 NOTE — Progress Notes (Signed)
CLINICAL SOCIAL WORK MATERNAL/CHILD NOTE  Patient Details  Name: Alyssa Owens MRN: [id number] Date of Birth: 10/30/2016  Date: 10/31/2015  Clinical Social Worker Initiating Note: Vidal Schwalbe, LCSWDate/ Time Initiated: 10/31/15/1203   Child's Name: Alyssa Owens   Legal Guardian: Mother   Need for Interpreter: None   Date of Referral: 10/31/15   Reason for Referral: Current Substance Use/Substance Use During Pregnancy    Referral Source: Physician   Address:    Phone number:     Household Members: Self, Parents   Natural Supports (not living in the home): Extended Family, Friends, Spouse/significant other   Professional Supports:None   Employment:Unemployed   Type of Work:   NA  Education: Database administrator Resources:Medicaid   Other Resources: [facility]   Cultural/Religious Considerations Which May Impact Care: None reported  Strengths: Ability to meet basic needs , Pediatrician chosen , Home prepared for child    Risk Factors/Current Problems: Substance Use  (history of THC)   Cognitive State: Able to Concentrate , Alert , Linear Thinking , Goal Oriented , Insightful    Mood/Affect: Comfortable , Calm , Interested    CSW Assessment:LCSW received consult for "drug exposed newborn". LCSW met with mother alone in room with newborn. MOB was very open to consult and understanding of role and reason for consult. MOB reports she has been open and honest with OBGYN about her THC use in early months of pregnancy and reports she has not used in many months. UDS was negative for mother. MOB reports she used because she did not know at first she was pregnant and was sick and needed something to help stimulate her appetite. Reports once she found out she was pregnant she stopped using and denies using since or any other substances including smoking. MOB appropriate with questions regarding next steps in  process to which LCSW explained time frame for cord to come back and positive results vs negative results. MOB agreeable to plan and fearful of CPS getting involved. LCSW normalized feelings and explained process. MOB reports good support in [facility] where she will return at discharge with her mother. Reports FOB is also supportive and involved, however not present during assessment. MOB reports she is active with [facility] and plans to continue. She wants to keep trying to breast feed and will do it as long as she can. LCSW also educated mother of depression after pregnancy and emotions. Discussed post-partum depression behaviors and symptoms and how to seek help if needed. Pt denies any emotional or behavioral problems at this time. She endorses excitement of being a mother and bonding with baby. MOB reports she has all resources at home for baby including car seat, crib, and clothing. She denies any needs at this time.   CSW Plan/Description: Psychosocial Support and Ongoing Assessment of Needs, Other (Comment) (Awaiting cord lab results )  Will complete drug exposed newborn paperwork once lab results have returned.  MOB will dc with her mother to home in [place].  LCSW will sign off at this time, if needs arise, please re-consult or call.   Lilly C[place], LCSW 10/31/2015, 12:05 PM

## 2015-10-31 NOTE — Progress Notes (Signed)
Post Partum Day 1, POD1 Subjective:  Alyssa Owens is a 25 y.o. G1P1000 [redacted]w[redacted]d s/p PLTCS.  No acute events overnight.  Pt denies problems with ambulating, voiding or po intake.  She denies nausea or vomiting.  Pain is well controlled.  She has had flatus.  Lochia Minimal.  Plan for birth control is Depo-Provera.  Method of Feeding: breastfeeding.  Objective: Blood pressure 103/54, pulse 60, temperature 98.6 F (37 C), temperature source Oral, resp. rate 18, last menstrual period 01/29/2015, SpO2 97 %, unknown if currently breastfeeding.  Physical Exam:  General: alert, cooperative and no distress Chest: normal WOB Heart: Regular rate Abdomen: +BS, soft DVT Evaluation: no evidence of DVT seen on physical exam. Extremities: no edema   Recent Labs  10/29/15 1140 10/31/15 0504  HGB 10.7* 8.4*  HCT 32.0* 25.1*    Assessment/Plan:  ASSESSMENT: Alyssa Owens is a 25 y.o. G1P1000 [redacted]w[redacted]d s/p PLTCS. Hemoglobin has dropped from 10.7 to 8.4 but patient denies dizziness, palpitations, or shortness of breath.  Start iron supplementation Plan for discharge tomorrow Continue routine PP care Breastfeeding support PRN  Milon Score, medical student  CNM attestation Post Partum Day #1   Alyssa Owens is a 25 y.o. G1P1001 s/p pLTCS.  Pt denies problems with ambulating, voiding or po intake. Pain is well controlled.  Plan for birth control is Depo-Provera.  Method of Feeding: breast  PE:  BP 109/73 mmHg  Pulse 70  Temp(Src) 98.6 F (37 C) (Oral)  Resp 18  Ht 5\' 1"  (1.549 m)  Wt 64.411 kg (142 lb)  BMI 26.84 kg/m2  SpO2 97%  LMP 01/29/2015 (Approximate)  Breastfeeding? Unknown Fundus firm  Plan for discharge: 11/01/15  Serita Grammes, CNM 12:26 AM

## 2015-10-31 NOTE — Lactation Note (Signed)
This note was copied from a baby's chart. Lactation Consultation Note New mom had c/section BF baby in cross cradle position when entered room. Baby BF well. Mom has short shaft nipples, BF in leaning back position, baby is pulling nipple out well. Mom states kinda hurt her stomach d/t c/section. Discussed positions. Assisted in football position. Gave mom shells to wear in bra in the afternoon when getting up and IV is d/c'd. Hand pump given for everting nipple more prior to latching and also stimulation. Taught how to use.  Mom encouraged to feed baby 8-12 times/24 hours and with feeding cues.  Educated about newborn behavior, I&O, STS, cluster feeding, supply and demand. Encouraged to call for assistance if needed and to verify proper latch. Hand expression taught to Mom w/drop of colostrum noted. Referred to Baby and Me Book in Breastfeeding section Pg. 22-23 for position options and Proper latch demonstration. [place] brochure given w/resources, support groups and Man services. Patient Name: Alyssa Owens [id number] Date: 10/31/2015 Reason for consult: Initial assessment   Maternal Data Has patient been taught Hand Expression?: Yes Does the patient have breastfeeding experience prior to this delivery?: No  Feeding Feeding Type: Breast Fed Length of feed: 20 min  LATCH Score/Interventions Latch: Repeated attempts needed to sustain latch, nipple held in mouth throughout feeding, stimulation needed to elicit sucking reflex. Intervention(s): Breast massage;Breast compression  Audible Swallowing: A few with stimulation  Type of Nipple: Everted at rest and after stimulation (very short shaft)  Comfort (Breast/Nipple): Soft / non-tender     Hold (Positioning): No assistance needed to correctly position infant at breast. Intervention(s): Breastfeeding basics reviewed;Support Pillows;Position options;Skin to skin  LATCH Score: 8  Lactation Tools Discussed/Used Tools: Pump;Shells Shell  Type: Inverted Breast pump type: Manual WIC Program: Yes Pump Review: Setup, frequency, and cleaning;Milk Storage Initiated by:: Allayne Stack RN Date initiated:: 10/31/15   Consult Status Consult Status: Follow-up Date: 10/31/15 (in pm) Follow-up type: In-patient    Leighana Neyman, Elta Guadeloupe 10/31/2015, 1:59 AM

## 2015-11-01 ENCOUNTER — Encounter (HOSPITAL_COMMUNITY): Payer: Self-pay | Admitting: *Deleted

## 2015-11-01 NOTE — Lactation Note (Signed)
This note was copied from a baby's chart. Lactation Consultation Note  Mother called LC into bathroom. Her sports bra was stuck to her nipples (abrasions) and she was scared to pull it off. Had her hand express milk to soften nipples to unattach and also applied coconut oil to tips of nipples to loosen. Mother has cracks and abrasion on both nipples and was very tender.  Her breasts are filling. Hand expressed  And use hand pump for flow of milk from both breasts but mother very uncomfortable and did not want to pump further. Applied #20NS.  Tried #24NS but it was too large at this time. Mother latched baby in football hold on L side.  Sucks and swallows heard and observed. Once baby unlatched, NS was full of breastmilk.  Mother happy.  It still hurts but pain is less w/ NS. Baby latched on R side also with #24NS. Suggest mother place shells in sports bra to prevent rubbing w/ coconut oil for soreness. Discussed that if she continues to use NS, once pain subsides she would need to start pumping w/ DEBP either tonight or tomorrow morning. Discussed plan w/ Casey Burkitt.    Patient Name: Alyssa Owens [id number] Date: 11/01/2015 Reason for consult: Follow-up assessment   Maternal Data    Feeding Feeding Type: Breast Fed  LATCH Score/Interventions Latch: Grasps breast easily, tongue down, lips flanged, rhythmical sucking.  Audible Swallowing: Spontaneous and intermittent  Type of Nipple: Everted at rest and after stimulation  Comfort (Breast/Nipple): Engorged, cracked, bleeding, large blisters, severe discomfort Problem noted: Cracked, bleeding, blisters, bruises Intervention(s): Expressed breast milk to nipple;Hand pump (coconut oil)  Problem noted: Cracked, bleeding, blisters, bruises Interventions  (Cracked/bleeding/bruising/blister): Expressed breast milk to nipple;Hand pump Interventions (Mild/moderate discomfort): Hand expression  Hold (Positioning): Assistance needed to  correctly position infant at breast and maintain latch.  LATCH Score: 7  Lactation Tools Discussed/Used Tools: Nipple Shields Nipple shield size: 20   Consult Status Consult Status: Follow-up Date: 11/02/15 Follow-up type: In-patient    Vivianne Master [facility] 11/01/2015, 3:59 PM

## 2015-11-01 NOTE — Progress Notes (Signed)
Post Partum Day 2, POD2 ** see fellow attestation for official billing documentation** Subjective:  Alyssa Owens is a 25 y.o. G1P1001 [redacted]w[redacted]d s/p PLTCS for breech and obstructing fibroid.  No acute events overnight.  Pt denies problems with ambulating, voiding or po intake.  She denies nausea or vomiting.  Pain is well controlled.  She has had flatus.  Lochia Minimal.  Plan for birth control is IUD.  Method of Feeding: breastfeeding.  Patient was started on iron supplementation yesterday for anemia. She denies dizziness, palpitations, or shortness of breath. She prefers to stay one more day because she still feels sore and wants to work on being more comfortable with walking around.  Objective: Blood pressure 109/53, pulse 66, temperature 98.4 F (36.9 C), temperature source Oral, resp. rate 16, height 5\' 1"  (1.549 m), weight 64.411 kg (142 lb), last menstrual period 01/29/2015, SpO2 97 %, unknown if currently breastfeeding.  Physical Exam:  General: alert, cooperative and no distress Chest: normal WOB Heart: Regular rate Abdomen: +BS, soft DVT Evaluation: no evidence of DVT seen on physical exam. Extremities: mild edema   Recent Labs  10/29/15 1140 10/31/15 0504  HGB 10.7* 8.4*  HCT 32.0* 25.1*    Assessment/Plan:  ASSESSMENT: Alyssa Owens is a 25 y.o. G1P1000 [redacted]w[redacted]d s/p PLTCS for breech and obstructing fibroid. She is doing well.  #Anemia: Patient denies dizziness, palpitations, or shortness of breath. Hgb was 8.4 on POD1. Continue iron supplementation  #Postpartum Care Plan for discharge tomorrow Continue routine PP care Breastfeeding support PRN  Milon Score, medical student  OB fellow attestation Post Partum Day 2 I have seen and examined this patient and agree with above documentation in the medical student's note.   Alyssa Owens is a 25 y.o. G1P1000 s/p pLTCS for breech.  Pt denies problems with ambulating, voiding or po intake. Pain is well  controlled. She reports some problems breastfeeding and is working with Therapist, sports and lactation.  Plan for birth control is IUD.  Method of Feeding: breast  PE:  BP 109/53 mmHg  Pulse 66  Temp(Src) 98.4 F (36.9 C) (Oral)  Resp 16  Ht 5\' 1"  (1.549 m)  Wt 142 lb (64.411 kg)  BMI 26.84 kg/m2  SpO2 97%  LMP 01/29/2015 (Approximate)  Breastfeeding? Unknown Gen: well appearing Heart: reg rate Lungs: normal WOB Fundus firm Ext: soft, no pain, no edema  Plan for discharge: POD#3 Lactation consult and support Continue routine care #Anemia, acute blood loss on chronic: started Ferrous sulfate  Caren Macadam, MD 2:15 PM

## 2015-11-02 MED ORDER — FERROUS SULFATE 325 (65 FE) MG PO TABS: 325.0000 mg | ORAL_TABLET | Freq: Every day | ORAL | Status: DC

## 2015-11-02 MED ORDER — OXYCODONE HCL 5 MG PO TABS: 5.0000 mg | ORAL_TABLET | ORAL | Status: DC | PRN

## 2015-11-02 MED ORDER — IBUPROFEN 600 MG PO TABS: 600.0000 mg | ORAL_TABLET | Freq: Four times a day (QID) | ORAL | Status: DC

## 2015-11-02 MED ORDER — ACETAMINOPHEN 325 MG PO TABS: 650.0000 mg | ORAL_TABLET | ORAL | Status: DC | PRN

## 2015-11-02 NOTE — Progress Notes (Signed)
Discharge papers and instructions reviewed with patient. Denies questions at this time.

## 2015-11-02 NOTE — Discharge Summary (Signed)
OB Discharge Summary     Patient Name: Alyssa Owens DOB: 03/18/1991 MRN: [id number]  Date of admission: 10/30/2015 Delivering MD: Tania Ade H   Date of discharge: 11/02/2015  Admitting diagnosis: CS FOR BREECH Intrauterine pregnancy: [redacted]w[redacted]d     Secondary diagnosis:  Active Problems:   Status post primary low transverse cesarean section  Additional problems: none     Discharge diagnosis: Term Pregnancy Delivered                                                                                                Post partum procedures:none  Augmentation: n/a  Complications: None  Hospital course:  Sceduled C/S   25 y.o. yo G1P1001 at [redacted]w[redacted]d was admitted to the hospital 10/30/2015 for scheduled cesarean section with the following indication: breech presentation, obstructing fibroid. Membrane Rupture Time/Date: 9:57 AM ,10/30/2015   Patient delivered a Viable infant.10/30/2015  Details of operation can be found in separate operative note.  Pateint had an uncomplicated postpartum course.  She is ambulating, tolerating a regular diet, passing flatus, and urinating well. Patient is discharged home in stable condition on  11/02/2015        H trended to 8.6, home on oral iron.   Physical exam  Filed Vitals:   10/31/15 2300 11/01/15 0524 11/01/15 1740 11/02/15 0521  BP:  109/53 107/69 109/73  Pulse:  66 67 70  Temp:  98.4 F (36.9 C) 99.2 F (37.3 C) 98.6 F (37 C)  TempSrc:  Oral Oral Oral  Resp:  16 18 18   Height: 5\' 1"  (1.549 m)     Weight: 142 lb (64.411 kg)     SpO2:       General: alert, cooperative and no distress Lochia: appropriate Uterine Fundus: firm Incision: Healing well with no significant drainage DVT Evaluation: No cords or calf tenderness. No significant calf/ankle edema. Labs: Lab Results  Component Value Date   WBC 12.8* 10/31/2015   HGB 8.4* 10/31/2015   HCT 25.1* 10/31/2015   MCV 78.4 10/31/2015   PLT 168 10/31/2015   CMP Latest Ref Rng  10/30/2015  Glucose 65 - 99 mg/dL 81  BUN 6 - 20 mg/dL <5(L)  Creatinine 0.44 - 1.00 mg/dL 0.63  Sodium 135 - 145 mmol/L 138  Potassium 3.5 - 5.1 mmol/L 3.8  Chloride 101 - 111 mmol/L 106  CO2 22 - 32 mmol/L 23  Calcium 8.9 - 10.3 mg/dL 9.3  Total Protein 6.5 - 8.1 g/dL 7.5  Total Bilirubin 0.3 - 1.2 mg/dL 1.2  Alkaline Phos 38 - 126 U/L 167(H)  AST 15 - 41 U/L 25  ALT 14 - 54 U/L 10(L)    Discharge instruction: per After Visit Summary and "Baby and Me Booklet".  After visit meds:    Medication List    STOP taking these medications        hydrocortisone 2.5 % rectal cream  Commonly known as:  ANUSOL-HC      TAKE these medications        acetaminophen 325 MG tablet  Commonly known as:  TYLENOL  Take 2  tablets (650 mg total) by mouth every 4 (four) hours as needed (for pain scale < 4).     ferrous sulfate 325 (65 FE) MG tablet  Take 1 tablet (325 mg total) by mouth daily with breakfast.     ibuprofen 600 MG tablet  Commonly known as:  ADVIL,MOTRIN  Take 1 tablet (600 mg total) by mouth every 6 (six) hours.     oxyCODONE 5 MG immediate release tablet  Commonly known as:  Oxy IR/ROXICODONE  Take 1 tablet (5 mg total) by mouth every 4 (four) hours as needed (pain scale 4-7).     prenatal vitamin w/FE, FA 27-1 MG Tabs tablet  Take 1 tablet by mouth daily.        Diet: routine diet  Activity: Advance as tolerated. Pelvic rest for 6 weeks.   Outpatient follow up:6 weeks Follow up Appt:Future Appointments Date Time Provider [place]  11/06/2015 2:15 PM Florian Buff, MD [facility]   Follow up Visit:No Follow-up on file.  Postpartum contraception: Depo Provera  Newborn Data: Live born female  Birth Weight: 7 lb 3.3 oz (3270 g) APGAR: 8, 9  Baby Feeding: Breast Disposition:home with mother   11/02/2015 Desma Maxim, MD

## 2015-11-02 NOTE — Discharge Instructions (Signed)
Cesarean Delivery, Care After  Refer to this sheet in the next few weeks. These instructions provide you with information on caring for yourself after your procedure. Your health care provider may also give you specific instructions. Your treatment has been planned according to current medical practices, but problems sometimes occur. Call your health care provider if you have any problems or questions after you go home.  HOME CARE INSTRUCTIONS   Only take over-the-counter or prescription medications as directed by your health care provider.   Do not drink alcohol, especially if you are breastfeeding or taking medication to relieve pain.   Do not chew or smoke tobacco.   Continue to use good perineal care. Good perineal care includes:    Wiping your perineum from front to back.    Keeping your perineum clean.   Check your surgical cut (incision) daily for increased redness, drainage, swelling, or separation of skin.   Clean your incision gently with soap and water every day, and then pat it dry. If your health care provider says it is okay, leave the incision uncovered. Use a bandage (dressing) if the incision is draining fluid or appears irritated. If the adhesive strips across the incision do not fall off within 7 days, carefully peel them off.   Hug a pillow when coughing or sneezing until your incision is healed. This helps to relieve pain.   Do not use tampons or douche until your health care provider says it is okay.   Shower, wash your hair, and take tub baths as directed by your health care provider.   Wear a well-fitting bra that provides breast support.   Limit wearing support panties or control-top hose.   Drink enough fluids to keep your urine clear or pale yellow.   Eat high-fiber foods such as whole grain cereals and breads, brown rice, beans, and fresh fruits and vegetables every day. These foods may help prevent or relieve constipation.   Resume activities such as climbing stairs,  driving, lifting, exercising, or traveling as directed by your health care provider.   Talk to your health care provider about resuming sexual activities. This is dependent upon your risk of infection, your rate of healing, and your comfort and desire to resume sexual activity.   Try to have someone help you with your household activities and your newborn for at least a few days after you leave the hospital.   Rest as much as possible. Try to rest or take a nap when your newborn is sleeping.   Increase your activities gradually.   Keep all of your scheduled postpartum appointments. It is very important to keep your scheduled follow-up appointments. At these appointments, your health care provider will be checking to make sure that you are healing physically and emotionally.  SEEK MEDICAL CARE IF:    You are passing large clots from your vagina. Save any clots to show your health care provider.   You have a foul smelling discharge from your vagina.   You have trouble urinating.   You are urinating frequently.   You have pain when you urinate.   You have a change in your bowel movements.   You have increasing redness, pain, or swelling near your incision.   You have pus draining from your incision.   Your incision is separating.   You have painful, hard, or reddened breasts.   You have a severe headache.   You have blurred vision or see spots.   You feel sad   or depressed.   You have thoughts of hurting yourself or your newborn.   You have questions about your care, the care of your newborn, or medications.   You are dizzy or light-headed.   You have a rash.   You have pain, redness, or swelling at the site of the removed intravenous access (IV) tube.   You have nausea or vomiting.   You stopped breastfeeding and have not had a menstrual period within 12 weeks of stopping.   You are not breastfeeding and have not had a menstrual period within 12 weeks of delivery.   You have a fever.  SEEK  IMMEDIATE MEDICAL CARE IF:   You have persistent pain.   You have chest pain.   You have shortness of breath.   You faint.   You have leg pain.   You have stomach pain.   Your vaginal bleeding saturates 2 or more sanitary pads in 1 hour.  MAKE SURE YOU:    Understand these instructions.   Will watch your condition.   Will get help right away if you are not doing well or get worse.     This information is not intended to replace advice given to you by your health care provider. Make sure you discuss any questions you have with your health care provider.     Document Released: 02/14/2002 Document Revised: 06/15/2014 Document Reviewed: 01/20/2012  Elsevier Interactive Patient Education 2016 [employer]

## 2015-11-02 NOTE — Lactation Note (Signed)
This note was copied from a baby's chart. Lactation Consultation Note: Mothers milk is coming . Her breast are very full. She post pumped approx. 120 ml within 10 mins. Her breast are still firm and full. Advised mother to massage / ice and post pump until breast are soft . Advised to pump for comfort all other pumping sessions. Infant is feeding well. Mother states that infant fed for only a few mins and then was very satisfied. Mother informed of out patient consult and advised to phone as needed. Encouraged mother and praised for doing so well with breastfeeding. Continue to feed infant 8-12 times in 24 hours. Mother receptive to all teaching.   Patient Name: Boy Lamonda Yamamura [id number] Date: 11/02/2015 Reason for consult: Follow-up assessment   Maternal Data    Feeding Feeding Type: Breast Fed Length of feed: 3 min  LATCH Score/Interventions Latch: Repeated attempts needed to sustain latch, nipple held in mouth throughout feeding, stimulation needed to elicit sucking reflex. Intervention(s): Assist with latch;Adjust position  Audible Swallowing: Spontaneous and intermittent (lots of swallowing) Intervention(s): Skin to skin;Hand expression  Type of Nipple: Everted at rest and after stimulation (looks flat after infant latch)  Comfort (Breast/Nipple): Filling, red/small blisters or bruises, mild/mod discomfort Problem noted: Engorgment Intervention(s): Ice Intervention(s): Double electric pump;Expressed breast milk to nipple;Hand pump (hand pump to soften )  Problem noted: Mild/Moderate discomfort Interventions (Mild/moderate discomfort): Post-pump  Hold (Positioning): Assistance needed to correctly position infant at breast and maintain latch.  LATCH Score: 7  Lactation Tools Discussed/Used     Consult Status Consult Status: Complete    Darla Lesches 11/02/2015, 11:20 AM

## 2015-11-06 ENCOUNTER — Encounter: Payer: Self-pay | Admitting: Obstetrics & Gynecology

## 2015-11-06 ENCOUNTER — Ambulatory Visit (INDEPENDENT_AMBULATORY_CARE_PROVIDER_SITE_OTHER): Payer: Medicaid Other | Admitting: Obstetrics & Gynecology

## 2015-11-06 VITALS — BP 130/90 | HR 72 | Wt 137.0 lb

## 2015-11-06 DIAGNOSIS — Z98891 History of uterine scar from previous surgery: Secondary | ICD-10-CM

## 2015-11-06 DIAGNOSIS — Z09 Encounter for follow-up examination after completed treatment for conditions other than malignant neoplasm: Secondary | ICD-10-CM

## 2015-11-06 MED ORDER — OXYCODONE HCL 5 MG PO TABS: 5.0000 mg | ORAL_TABLET | ORAL | Status: DC | PRN

## 2015-11-06 NOTE — Progress Notes (Signed)
Patient ID: Alyssa Owens, female   DOB: June 01, 1991, 25 y.o.   MRN: [id number]  HPI: Patient returns for routine postoperative follow-up having undergone primary Caesarean section for breech/fibroids  on 10/30/2015.  The patient's immediate postoperative recovery has been unremarkable. Since hospital discharge the patient reports incisional pain.   Current Outpatient Prescriptions: ferrous sulfate 325 (65 FE) MG tablet, Take 1 tablet (325 mg total) by mouth daily with breakfast., Disp: 60 tablet, Rfl: 1 ibuprofen (ADVIL,MOTRIN) 600 MG tablet, Take 1 tablet (600 mg total) by mouth every 6 (six) hours., Disp: 30 tablet, Rfl: 0 oxyCODONE (OXY IR/ROXICODONE) 5 MG immediate release tablet, Take 1 tablet (5 mg total) by mouth every 4 (four) hours as needed for moderate pain or severe pain., Disp: 30 tablet, Rfl: 0 prenatal vitamin w/FE, FA (PRENATAL 1 + 1) 27-1 MG TABS tablet, Take 1 tablet by mouth daily. , Disp: , Rfl:  acetaminophen (TYLENOL) 325 MG tablet, Take 2 tablets (650 mg total) by mouth every 4 (four) hours as needed (for pain scale < 4). (Patient not taking: Reported on 11/06/2015), Disp: 60 tablet, Rfl: 3  No current facility-administered medications for this visit.    Blood pressure 130/90, pulse 72, weight 137 lb (62.143 kg), unknown if currently breastfeeding.  Physical Exam: Incision clean dry intact  Diagnostic Tests:   Pathology: Benign  Meds ordered this encounter  Medications  . oxyCODONE (OXY IR/ROXICODONE) 5 MG immediate release tablet    Sig: Take 1 tablet (5 mg total) by mouth every 4 (four) hours as needed for moderate pain or severe pain.    Dispense:  30 tablet    Refill:  0     Impression: S/p primary Cesarean section Normal post op course  Plan:   Follow up: 5  weeks  Florian Buff, MD

## 2015-11-15 ENCOUNTER — Telehealth: Payer: Self-pay | Admitting: *Deleted

## 2015-11-15 NOTE — Telephone Encounter (Signed)
Santiago Glad from the [facility] states pt scored 12 on depression scale, crying a lot, no thoughts of harming herself or others.   Pt given an appt for 11/19/2015 for evaluation.

## 2015-11-19 ENCOUNTER — Ambulatory Visit (INDEPENDENT_AMBULATORY_CARE_PROVIDER_SITE_OTHER): Payer: Medicaid Other | Admitting: Advanced Practice Midwife

## 2015-11-19 ENCOUNTER — Encounter: Payer: Self-pay | Admitting: Advanced Practice Midwife

## 2015-11-19 VITALS — BP 138/86 | HR 60 | Ht 61.0 in | Wt 129.0 lb

## 2015-11-19 DIAGNOSIS — F53 Postpartum depression: Secondary | ICD-10-CM

## 2015-11-19 DIAGNOSIS — O99345 Other mental disorders complicating the puerperium: Principal | ICD-10-CM

## 2015-11-19 MED ORDER — ESCITALOPRAM OXALATE 10 MG PO TABS: 10.0000 mg | ORAL_TABLET | Freq: Every day | ORAL | Status: DC

## 2015-11-19 NOTE — Progress Notes (Signed)
   [facility] Visit  Patient name: Alyssa Owens MRN [id number]  Date of birth: Dec 20, 1990  CC & HPI:  Alyssa Owens is a 25 y.o. African American female presenting today for evaluation of depression. Delived 3 weeks ago w/o complications.  Feels overwhelmed, moody, emotional.  Has good support system.  Denies SI/HI.    Pertinent History Reviewed:  Medical & Surgical Hx:   Past Medical History  Diagnosis Date  . Medical history non-contributory   . Pregnancy   . Fibroid    Past Surgical History  Procedure Laterality Date  . No past surgeries    . Cesarean section N/A 10/30/2015    Procedure: CESAREAN SECTION;  Surgeon: Florian Buff, MD;  Location: [facility];  Service: Obstetrics;  Laterality: N/A;   Family History  Problem Relation Age of Onset  . Arthritis Mother   . Cancer Mother     breast  . Hypertension Mother   . Heart disease Father     Current outpatient prescriptions:  .  ferrous sulfate 325 (65 FE) MG tablet, Take 1 tablet (325 mg total) by mouth daily with breakfast., Disp: 60 tablet, Rfl: 1 .  ibuprofen (ADVIL,MOTRIN) 600 MG tablet, Take 1 tablet (600 mg total) by mouth every 6 (six) hours., Disp: 30 tablet, Rfl: 0 .  oxyCODONE (OXY IR/ROXICODONE) 5 MG immediate release tablet, Take 1 tablet (5 mg total) by mouth every 4 (four) hours as needed for moderate pain or severe pain., Disp: 30 tablet, Rfl: 0 .  prenatal vitamin w/FE, FA (PRENATAL 1 + 1) 27-1 MG TABS tablet, Take 1 tablet by mouth daily. , Disp: , Rfl:  Social History: Reviewed -  reports that she quit smoking about 7 weeks ago. She has never used smokeless tobacco.  Review of Systems:   Constitutional: Negative for fever and chills Eyes: Negative for visual disturbances Respiratory: Negative for shortness of breath, dyspnea Cardiovascular: Negative for chest pain or palpitations  Gastrointestinal: Negative for vomiting, diarrhea and constipation; no abdominal  pain Genitourinary: Negative for dysuria and urgency, vaginal irritation or itching Musculoskeletal: Negative for back pain, joint pain, myalgias  Neurological: Negative for dizziness and headaches    Objective Findings:    Physical Examination: General appearance - well appearing, and in no distress Mental status - alert, oriented to person, place, and time Chest:  Normal respiratory effort Heart - normal rate and regular rhythm Musculoskeletal:  Normal range of motion without pain Extremities:  No edema  Discussed PPD vs blues.  Offered therapy referal and declined.  Wants to try SSRI.     No results found for this or any previous visit (from the past 24 hour(s)).    Assessment & Plan:  A:   PPDepression P:  Rx lexapro 10mg  daily.  Agreement made that pt will let me or someone know if she develops SI.   Try online PPD chat rooms/support groups Has appt in a few weeks.    Return for As scheduled.  CRESENZO-DISHMAN,Elishua Radford CNM 11/19/2015 3:25 PM

## 2015-12-11 ENCOUNTER — Encounter: Payer: Self-pay | Admitting: *Deleted

## 2015-12-11 ENCOUNTER — Ambulatory Visit (INDEPENDENT_AMBULATORY_CARE_PROVIDER_SITE_OTHER): Payer: Medicaid Other | Admitting: *Deleted

## 2015-12-11 ENCOUNTER — Ambulatory Visit (INDEPENDENT_AMBULATORY_CARE_PROVIDER_SITE_OTHER): Payer: Medicaid Other | Admitting: Women's Health

## 2015-12-11 ENCOUNTER — Encounter: Payer: Self-pay | Admitting: Women's Health

## 2015-12-11 DIAGNOSIS — Z3042 Encounter for surveillance of injectable contraceptive: Secondary | ICD-10-CM | POA: Diagnosis not present

## 2015-12-11 DIAGNOSIS — F53 Postpartum depression: Secondary | ICD-10-CM | POA: Insufficient documentation

## 2015-12-11 DIAGNOSIS — Z3202 Encounter for pregnancy test, result negative: Secondary | ICD-10-CM | POA: Diagnosis not present

## 2015-12-11 DIAGNOSIS — O99345 Other mental disorders complicating the puerperium: Secondary | ICD-10-CM

## 2015-12-11 LAB — POCT URINE PREGNANCY: PREG TEST UR: NEGATIVE

## 2015-12-11 MED ORDER — ESCITALOPRAM OXALATE 20 MG PO TABS: 20.0000 mg | ORAL_TABLET | Freq: Every day | ORAL | Status: DC

## 2015-12-11 MED ORDER — MEDROXYPROGESTERONE ACETATE 150 MG/ML IM SUSP: 150.0000 mg | INTRAMUSCULAR | Status: DC

## 2015-12-11 MED ORDER — MEDROXYPROGESTERONE ACETATE 150 MG/ML IM SUSP
150.0000 mg | Freq: Once | INTRAMUSCULAR | Status: AC
  Administered 2015-12-11: 150 mg via INTRAMUSCULAR

## 2015-12-11 NOTE — Progress Notes (Signed)
Pt here for Depo. Pt tolerated shot well. Return in 12 weeks for next shot. JSY 

## 2015-12-11 NOTE — Patient Instructions (Signed)
Let us know if you feel the depression is getting worse  Medroxyprogesterone injection [Contraceptive]- Depo What is this medicine? MEDROXYPROGESTERONE (me DROX ee proe JES te rone) contraceptive injections prevent pregnancy. They provide effective birth control for 3 months. Depo-subQ Provera 104 is also used for treating pain related to endometriosis. This medicine may be used for other purposes; ask your health care provider or pharmacist if you have questions. What should I tell my health care provider before I take this medicine? They need to know if you have any of these conditions: -frequently drink alcohol -asthma -blood vessel disease or a history of a blood clot in the lungs or legs -bone disease such as osteoporosis -breast cancer -diabetes -eating disorder (anorexia nervosa or bulimia) -high blood pressure -HIV infection or AIDS -kidney disease -liver disease -mental depression -migraine -seizures (convulsions) -stroke -tobacco smoker -vaginal bleeding -an unusual or allergic reaction to medroxyprogesterone, other hormones, medicines, foods, dyes, or preservatives -pregnant or trying to get pregnant -breast-feeding How should I use this medicine? Depo-Provera Contraceptive injection is given into a muscle. Depo-subQ Provera 104 injection is given under the skin. These injections are given by a health care professional. You must not be pregnant before getting an injection. The injection is usually given during the first 5 days after the start of a menstrual period or 6 weeks after delivery of a baby. Talk to your pediatrician regarding the use of this medicine in children. Special care may be needed. These injections have been used in female children who have started having menstrual periods. Overdosage: If you think you have taken too much of this medicine contact a poison control center or emergency room at once. NOTE: This medicine is only for you. Do not share this  medicine with others. What if I miss a dose? Try not to miss a dose. You must get an injection once every 3 months to maintain birth control. If you cannot keep an appointment, call and reschedule it. If you wait longer than 13 weeks between Depo-Provera contraceptive injections or longer than 14 weeks between Depo-subQ Provera 104 injections, you could get pregnant. Use another method for birth control if you miss your appointment. You may also need a pregnancy test before receiving another injection. What may interact with this medicine? Do not take this medicine with any of the following medications: -bosentan This medicine may also interact with the following medications: -aminoglutethimide -antibiotics or medicines for infections, especially rifampin, rifabutin, rifapentine, and griseofulvin -aprepitant -barbiturate medicines such as phenobarbital or primidone -bexarotene -carbamazepine -medicines for seizures like ethotoin, felbamate, oxcarbazepine, phenytoin, topiramate -modafinil -St. John's wort This list may not describe all possible interactions. Give your health care provider a list of all the medicines, herbs, non-prescription drugs, or dietary supplements you use. Also tell them if you smoke, drink alcohol, or use illegal drugs. Some items may interact with your medicine. What should I watch for while using this medicine? This drug does not protect you against HIV infection (AIDS) or other sexually transmitted diseases. Use of this product may cause you to lose calcium from your bones. Loss of calcium may cause weak bones (osteoporosis). Only use this product for more than 2 years if other forms of birth control are not right for you. The longer you use this product for birth control the more likely you will be at risk for weak bones. Ask your health care professional how you can keep strong bones. You may have a change in bleeding pattern or irregular  periods. Many females stop  having periods while taking this drug. If you have received your injections on time, your chance of being pregnant is very low. If you think you may be pregnant, see your health care professional as soon as possible. Tell your health care professional if you want to get pregnant within the next year. The effect of this medicine may last a long time after you get your last injection. What side effects may I notice from receiving this medicine? Side effects that you should report to your doctor or health care professional as soon as possible: -allergic reactions like skin rash, itching or hives, swelling of the face, lips, or tongue -breast tenderness or discharge -breathing problems -changes in vision -depression -feeling faint or lightheaded, falls -fever -pain in the abdomen, chest, groin, or leg -problems with balance, talking, walking -unusually weak or tired -yellowing of the eyes or skin Side effects that usually do not require medical attention (report to your doctor or health care professional if they continue or are bothersome): -acne -fluid retention and swelling -headache -irregular periods, spotting, or absent periods -temporary pain, itching, or skin reaction at site where injected -weight gain This list may not describe all possible side effects. Call your doctor for medical advice about side effects. You may report side effects to FDA at 1-800-FDA-1088. Where should I keep my medicine? This does not apply. The injection will be given to you by a health care professional. NOTE: This sheet is a summary. It may not cover all possible information. If you have questions about this medicine, talk to your doctor, pharmacist, or health care provider.    2016, Elsevier/Gold Standard. (2008-06-15 18:37:56)

## 2015-12-11 NOTE — Progress Notes (Signed)
Patient ID: Alyssa Owens, female   DOB: Nov 04, 1990, 25 y.o.   MRN: [id number] Subjective:    Alyssa Owens is a 25 y.o. G41P1001 African American female who presents for a postpartum visit. She is 6 weeks postpartum following a primary cesarean section, low transverse incision at 39.1 gestational weeks d/t breech w/ obstructing posterior LUS fibroid making ECV attempt inappropriate. Anesthesia: spinal. I have fully reviewed the prenatal and intrapartum course. Postpartum course has been complicated by PPD. Baby's course has been uncomplicated. Baby is feeding by bottle. Bleeding spotting. Bowel function is normal. Bladder function is normal. Patient is not sexually active. Last sexual activity: prior to birth of baby. Contraception method is wants depo- discussed that depo can potentially worsen depression- to let us know if this happens. Postpartum depression screening: positive. Score 18.  Taking Lexapro 10mg  daily rx'd 11/19/15 at visit for PPD, so has been a little over 3wks and EPDS has increased from 12 to 18. Not eating/sleeping well, still finds joy in some things she used to. Lives w/ mom who helps w/ baby, fob also helps w/ baby. Denies h/o depression/anxiety. Denies SI/HI/II. Still declines counseling. Last pap 03/07/13 and was neg @ [facility].  The following portions of the patient's history were reviewed and updated as appropriate: allergies, current medications, past medical history, past surgical history and problem list.  Review of Systems Pertinent items are noted in HPI.   Filed Vitals:   12/11/15 1006  BP: 120/80  Pulse: 66  Height: 5\' 1"  (1.549 m)  Weight: 122 lb (55.339 kg)   No LMP recorded.  Objective:   General:  alert, cooperative and no distress   Breasts:  deferred, no complaints  Lungs: clear to auscultation bilaterally  Heart:  regular rate and rhythm  Abdomen: soft, nontender, still w/ lots of dermabond on incision, peeled as much as I could away from actual  incision, small spot Lt side of incision w/ some light brown slightly foul smelling drainage when dermabond pulled away, wiped area and applied silver nitrate- area looks closed, no further drainage, no redness/edema   Vulva: normal  Vagina: normal vagina  Cervix:  closed  Corpus: Well-involuted  Adnexa:  Non-palpable  Rectal Exam: No hemorrhoids        Assessment:   Postpartum exam 6 wks s/p PLTCS for breech/obstructing fibroid Bottlefeeding Depression screening PPD/anxiety not well controlled on lexapro 10mg  daily Contraception counseling   Plan:  Increase Lexapro to 20mg  daily Contraception: Depo-Provera injections Follow up in: today for 1st depo injection- let us know if depression worsens or SI/II. Then f/u 4wks for med f/u, then 60mths for pap & physical Discussed LUS fibroid w/ JVF, no need to get u/s at this time, if begins to cause pain/heavy periods will reassess  Tawnya Crook CNM, [facility] 12/11/2015 10:25 AM

## 2016-01-08 ENCOUNTER — Ambulatory Visit: Payer: Self-pay | Admitting: Women's Health

## 2016-03-04 ENCOUNTER — Ambulatory Visit (INDEPENDENT_AMBULATORY_CARE_PROVIDER_SITE_OTHER): Payer: Self-pay | Admitting: *Deleted

## 2016-03-04 DIAGNOSIS — Z3042 Encounter for surveillance of injectable contraceptive: Secondary | ICD-10-CM

## 2016-03-16 ENCOUNTER — Other Ambulatory Visit: Payer: Medicaid Other | Admitting: Women's Health

## 2016-04-16 ENCOUNTER — Other Ambulatory Visit: Payer: Self-pay | Admitting: Adult Health

## 2016-04-28 ENCOUNTER — Encounter: Payer: Self-pay | Admitting: Adult Health

## 2016-04-28 ENCOUNTER — Ambulatory Visit (INDEPENDENT_AMBULATORY_CARE_PROVIDER_SITE_OTHER): Payer: Medicaid Other | Admitting: Adult Health

## 2016-04-28 ENCOUNTER — Other Ambulatory Visit (HOSPITAL_COMMUNITY)
Admission: RE | Admit: 2016-04-28 | Discharge: 2016-04-28 | Disposition: A | Discharge status: Home / Self Care | Payer: Medicaid Other | Source: Ambulatory Visit | Attending: Obstetrics & Gynecology | Admitting: Obstetrics & Gynecology

## 2016-04-28 VITALS — BP 120/82 | HR 66 | Ht 61.0 in | Wt 140.0 lb

## 2016-04-28 DIAGNOSIS — Z124 Encounter for screening for malignant neoplasm of cervix: Secondary | ICD-10-CM

## 2016-04-28 DIAGNOSIS — Z0000 Encounter for general adult medical examination without abnormal findings: Secondary | ICD-10-CM

## 2016-04-28 DIAGNOSIS — Z3042 Encounter for surveillance of injectable contraceptive: Secondary | ICD-10-CM | POA: Diagnosis not present

## 2016-04-28 DIAGNOSIS — Z01419 Encounter for gynecological examination (general) (routine) without abnormal findings: Secondary | ICD-10-CM | POA: Insufficient documentation

## 2016-04-28 MED ORDER — MEDROXYPROGESTERONE ACETATE 150 MG/ML IM SUSP: 150.0000 mg | INTRAMUSCULAR | 4 refills | Status: AC

## 2016-04-28 NOTE — Patient Instructions (Addendum)
Physical in 1 year Pap in 3 years if normal  Get depo as scheduled,07/01/16 , use condoms

## 2016-04-28 NOTE — Progress Notes (Signed)
Patient ID: DACOTAH RIAL, female   DOB: November 06, 1990, 25 y.o.   MRN: [id number] History of Present Illness:  Alyssa Owens is a 25 year old black female,G1P1, in for a well woman gyn exam and pap.She is using Depo and is happy with it. She got last shot at health dept,had mix up in [place], next shot due 07/01/16.  Current Medications, Allergies, Past Medical History, Past Surgical History, Family History and Social History were reviewed in [employer] record.     Review of Systems:  Patient denies any headaches, hearing loss, fatigue, blurred vision, shortness of breath, chest pain, abdominal pain, problems with bowel movements, urination, or intercourse. No joint pain or mood swings.   Physical Exam:BP 120/82 (BP Location: Left Arm, Patient Position: Sitting, Cuff Size: Normal)   Pulse 66   Ht 5\' 1"  (1.549 m)   Wt 140 lb (63.5 kg)   Breastfeeding? No   BMI 26.45 kg/m  General:  Well developed, well nourished, no acute distress Skin:  Warm and dry Neck:  Midline trachea, normal thyroid, good ROM, no lymphadenopathy Lungs; Clear to auscultation bilaterally Breast:  No dominant palpable mass, retraction, or nipple discharge Cardiovascular: Regular rate and rhythm Abdomen:  Soft, non tender, no hepatosplenomegaly Pelvic:  External genitalia is normal in appearance, no lesions.  The vagina is normal in appearance. Urethra has no lesions or masses. The cervix is bulbous, and smooth, pap with reflex HPV performed.  Uterus is felt to be normal size, shape, and contour.  No adnexal masses or tenderness noted.Bladder is non tender, no masses felt. Extremities/musculoskeletal:  No swelling or varicosities noted, no clubbing or cyanosis Psych:  No mood changes, alert and cooperative,seems happy She declined GC/CHL on pap says had negative test at health dept.  PHQ 2 score 0  Impression: 1. Encounter for routine gynecological examination with Papanicolaou smear of cervix    2. Routine cervical smear   3. Encounter for surveillance of injectable contraceptive       Plan: Meds ordered this encounter  Medications  . medroxyPROGESTERone (DEPO-PROVERA) 150 MG/ML injection    Sig: Inject 1 mL (150 mg total) into the muscle every 3 (three) months.    Dispense:  1 mL    Refill:  4    Order Specific Question:   Supervising Provider    Answer:   Florian Buff [[id number]]   Physical in 1 year Pap in 3 years if normal  Get depo as scheduled,07/01/16 , use condoms

## 2016-04-29 LAB — CYTOLOGY - PAP: DIAGNOSIS: NEGATIVE

## 2016-05-15 ENCOUNTER — Ambulatory Visit (INDEPENDENT_AMBULATORY_CARE_PROVIDER_SITE_OTHER): Payer: Medicaid Other | Admitting: Pediatrics

## 2016-05-15 ENCOUNTER — Encounter: Payer: Self-pay | Admitting: Pediatrics

## 2016-05-15 ENCOUNTER — Ambulatory Visit: Payer: Medicaid Other | Admitting: Pediatrics

## 2016-05-15 VITALS — BP 122/82 | HR 72 | Temp 98.2°F | Ht 61.0 in | Wt 143.4 lb

## 2016-05-15 DIAGNOSIS — J029 Acute pharyngitis, unspecified: Secondary | ICD-10-CM | POA: Diagnosis not present

## 2016-05-15 DIAGNOSIS — J069 Acute upper respiratory infection, unspecified: Secondary | ICD-10-CM | POA: Diagnosis not present

## 2016-05-15 LAB — RAPID STREP SCREEN (MED CTR MEBANE ONLY): Strep Gp A Ag, IA W/Reflex: NEGATIVE

## 2016-05-15 LAB — CULTURE, GROUP A STREP

## 2016-05-15 MED ORDER — FLUTICASONE PROPIONATE 50 MCG/ACT NA SUSP: 2.0000 | Freq: Every day | NASAL | 0 refills | Status: DC

## 2016-05-15 MED ORDER — CETIRIZINE HCL 10 MG PO TABS: 10.0000 mg | ORAL_TABLET | Freq: Every day | ORAL | 0 refills | Status: DC

## 2016-05-15 NOTE — Patient Instructions (Signed)
Netipot with distilled water 2-3 times a day to clear out sinuses Or Normal saline nasal spray Flonase steroid nasal spray 2 sprays each side once a day Cetirizine or similar antihistamine daily Ibuprofen 600mg  three times a day Lots of fluids

## 2016-05-15 NOTE — Progress Notes (Signed)
  Subjective:   Patient ID: Alyssa Owens, female    DOB: [birth year], 25 y.o.   MRN: [id number] CC: New Patient (Initial Visit); Sore Throat; and Nasal Congestion  HPI: Alyssa Owens is a 25 y.o. female presenting for New Patient (Initial Visit); Sore Throat; and Nasal Congestion  Symptoms for past two days Headache, pressure over sinuses Sore throat No coughing Not taking anything at home H/o tonsillitis No ear pain  Has 6 mo son at home he has been sick as well  Past Medical History:  Diagnosis Date  . Fibroid   . Medical history non-contributory   . Pregnancy    Family History  Problem Relation Age of Onset  . Arthritis Mother   . Cancer Mother     breast  . Hypertension Mother   . Heart disease Father    Social History   Social History  . Marital status: Single    Spouse name: N/A  . Number of children: N/A  . Years of education: N/A   Social History Main Topics  . Smoking status: Current Every Day Smoker    Packs/day: 0.25    Years: 2.00    Types: Cigarettes    Last attempt to quit: 10/01/2015  . Smokeless tobacco: Never Used  . Alcohol use Yes     Comment: occ  . Drug use: No     Comment: 3 mos ago as of 10/24/2015  . Sexual activity: Yes    Birth control/ protection: Injection   Other Topics Concern  . None   Social History Narrative  . None   ROS: All systems negative other than what is in HPI  Objective:    BP 122/82   Pulse 72   Temp 98.2 F (36.8 C) (Oral)   Ht 5\' 1"  (1.549 m)   Wt 143 lb 6.4 oz (65 kg)   BMI 27.10 kg/m   Wt Readings from Last 3 Encounters:  05/15/16 143 lb 6.4 oz (65 kg)  04/28/16 140 lb (63.5 kg)  12/11/15 122 lb (55.3 kg)    Gen: NAD, alert, cooperative with exam, NCAT, congested EYES: EOMI, no conjunctival injection, or no icterus ENT:  TMs dull gray b/l, OP with erythema, TTP max sinuses, frontal sinuses b/l LYMPH: no cervical LAD CV: NRRR, normal S1/S2, no murmur, distal pulses 2+ b/l Resp: CTABL,  no wheezes, normal WOB Abd: +BS, soft, NTND. no guarding or organomegaly Ext: No edema, warm Neuro: Alert and oriented, strength equal b/l UE and LE, coordination grossly normal MSK: normal muscle bulk  Assessment & Plan:  Alyssa Owens was seen today for new patient (initial visit), sore throat and nasal congestion.  Diagnoses and all orders for this visit:  Acute URI Discussed symptomatic care -     fluticasone (FLONASE) 50 MCG/ACT nasal spray; Place 2 sprays into both nostrils daily. -     cetirizine (ZYRTEC) 10 MG tablet; Take 1 tablet (10 mg total) by mouth daily.  Sore throat Rapid negative, will send for culture -     Rapid strep screen (not at [facility])  Follow up plan: As needed Assunta Found, MD [place]

## 2016-05-18 LAB — CULTURE, GROUP A STREP: STREP A CULTURE: NEGATIVE

## 2016-07-01 ENCOUNTER — Ambulatory Visit: Payer: Medicaid Other

## 2019-01-07 ENCOUNTER — Emergency Department (HOSPITAL_COMMUNITY)
Admission: EM | Admit: 2019-01-07 | Discharge: 2019-01-07 | Disposition: A | Discharge status: Home / Self Care | Payer: Self-pay | Attending: Emergency Medicine | Admitting: Emergency Medicine

## 2019-01-07 ENCOUNTER — Encounter (HOSPITAL_COMMUNITY): Payer: Self-pay | Admitting: Emergency Medicine

## 2019-01-07 ENCOUNTER — Other Ambulatory Visit: Payer: Self-pay

## 2019-01-07 DIAGNOSIS — Y929 Unspecified place or not applicable: Secondary | ICD-10-CM | POA: Insufficient documentation

## 2019-01-07 DIAGNOSIS — W1843XA Slipping, tripping and stumbling without falling due to stepping from one level to another, initial encounter: Secondary | ICD-10-CM | POA: Insufficient documentation

## 2019-01-07 DIAGNOSIS — F17210 Nicotine dependence, cigarettes, uncomplicated: Secondary | ICD-10-CM | POA: Insufficient documentation

## 2019-01-07 DIAGNOSIS — S91212A Laceration without foreign body of left great toe with damage to nail, initial encounter: Secondary | ICD-10-CM | POA: Insufficient documentation

## 2019-01-07 DIAGNOSIS — Y999 Unspecified external cause status: Secondary | ICD-10-CM | POA: Insufficient documentation

## 2019-01-07 DIAGNOSIS — S91209A Unspecified open wound of unspecified toe(s) with damage to nail, initial encounter: Secondary | ICD-10-CM

## 2019-01-07 DIAGNOSIS — Y9389 Activity, other specified: Secondary | ICD-10-CM | POA: Insufficient documentation

## 2019-01-07 MED ORDER — LIDOCAINE HCL (PF) 2 % IJ SOLN
10.0000 mL | Freq: Once | INTRAMUSCULAR | Status: AC
Start: 2019-01-07 — End: 2019-01-07
  Administered 2019-01-07: 14:00:00 10 mL

## 2019-01-07 NOTE — ED Triage Notes (Signed)
Pt reports stubbing her left great toe last night and the toenail is flipped back but still attached.

## 2019-01-07 NOTE — ED Notes (Signed)
Telfa with tube gauze applied to left great toe. Patient tolerated well.

## 2019-01-07 NOTE — ED Provider Notes (Signed)
[facility] Emergency Department Provider Note MRN:  [id number]  Arrival date & time: 01/07/19     Chief Complaint   Toe Injury   History of Present Illness   Alyssa Owens is a 28 y.o. year-old female with no pertinent past medical history presenting to the ED with chief complaint of toe injury.  Patient was walking up the stairs when she stumbled and her left great toenail got caught on the stairs and pulled away from her toe, causing significant pain and some bleeding.  Toenail is still attached but only slightly.  She denies fall, no other trauma.  Pain is moderate, constant, worse with palpation.  Review of Systems  A complete 10 system review of systems was obtained and all systems are negative except as noted in the HPI and PMH.   Patient's Health History    Past Medical History:  Diagnosis Date  . Fibroid   . Medical history non-contributory   . Pregnancy     Past Surgical History:  Procedure Laterality Date  . CESAREAN SECTION N/A 10/30/2015   Procedure: CESAREAN SECTION;  Surgeon: Florian Buff, MD;  Location: [facility];  Service: Obstetrics;  Laterality: N/A;  . NO PAST SURGERIES      Family History  Problem Relation Age of Onset  . Arthritis Mother   . Cancer Mother        breast  . Hypertension Mother   . Heart disease Father     Social History   Socioeconomic History  . Marital status: Single    Spouse name: Not on file  . Number of children: Not on file  . Years of education: Not on file  . Highest education level: Not on file  Occupational History  . Not on file  Social Needs  . Financial resource strain: Not on file  . Food insecurity    Worry: Not on file    Inability: Not on file  . Transportation needs    Medical: Not on file    Non-medical: Not on file  Tobacco Use  . Smoking status: Current Every Day Smoker    Packs/day: 0.25    Years: 2.00    Pack years: 0.50    Types: Cigarettes    Last attempt to  quit: 10/01/2015    Years since quitting: 3.2  . Smokeless tobacco: Never Used  Substance and Sexual Activity  . Alcohol use: Yes    Comment: occ  . Drug use: No    Types: Marijuana  . Sexual activity: Yes    Birth control/protection: Injection  Lifestyle  . Physical activity    Days per week: Not on file    Minutes per session: Not on file  . Stress: Not on file  Relationships  . Social Herbalist on phone: Not on file    Gets together: Not on file    Attends religious service: Not on file    Active member of club or organization: Not on file    Attends meetings of clubs or organizations: Not on file    Relationship status: Not on file  . Intimate partner violence    Fear of current or ex partner: Not on file    Emotionally abused: Not on file    Physically abused: Not on file    Forced sexual activity: Not on file  Other Topics Concern  . Not on file  Social History Narrative  . Not on file  Physical Exam  Vital Signs and Nursing Notes reviewed Vitals:   01/07/19 1236  BP: (!) 122/98  Pulse: 100  Resp: 16  Temp: 98.4 F (36.9 C)  SpO2: 100%    CONSTITUTIONAL: Well-appearing, NAD NEURO:  Alert and oriented x 3, no focal deficits EYES:  eyes equal and reactive ENT/NECK:  no LAD, no JVD CARDIO: Regular rate, well-perfused, normal S1 and S2 PULM:  CTAB no wheezing or rhonchi GI/GU:  normal bowel sounds, non-distended, non-tender MSK/SPINE:  No gross deformities, no edema SKIN:  no rash; partial avulsion of the left great toenail with intact nail bed PSYCH:  Appropriate speech and behavior  Diagnostic and Interventional Summary    Labs Reviewed - No data to display  No orders to display    Medications  lidocaine (XYLOCAINE) 2 % injection 10 mL (has no administration in time range)     .Nerve Block  Date/Time: 01/07/2019 2:08 PM Performed by: Maudie Flakes, MD Authorized by: Maudie Flakes, MD   Consent:    Consent obtained:  Verbal    Consent given by:  Patient   Risks discussed:  Allergic reaction, infection, nerve damage, pain and unsuccessful block   Alternatives discussed:  No treatment Indications:    Indications:  Procedural anesthesia Location:    Nerve block body site: Left great toe. Pre-procedure details:    Skin preparation:  Alcohol Procedure details (see MAR for exact dosages):    Block needle gauge:  25 G   Anesthetic injected:  Lidocaine 2% w/o epi   Injection procedure:  Anatomic landmarks identified and anatomic landmarks palpated   Paresthesia:  None Post-procedure details:    Outcome:  Anesthesia achieved   Patient tolerance of procedure:  Tolerated well, no immediate complications .Marland KitchenLaceration Repair  Date/Time: 01/07/2019 2:09 PM Performed by: Maudie Flakes, MD Authorized by: Maudie Flakes, MD   Consent:    Consent obtained:  Verbal   Consent given by:  Patient   Risks discussed:  Infection, need for additional repair and poor cosmetic result   Alternatives discussed:  No treatment Anesthesia (see MAR for exact dosages):    Anesthesia method:  Nerve block Laceration details:    Location:  Toe   Toe location:  L big toe Repair type:    Repair type:  Simple Treatment:    Amount of cleaning:  Standard   Irrigation solution:  Sterile saline   Irrigation method:  Syringe Skin repair:    Repair method:  Tissue adhesive Approximation:    Approximation:  Close Post-procedure details:    Dressing:  Non-adherent dressing   Patient tolerance of procedure:  Tolerated well, no immediate complications Comments:     Partial toenail avulsion with nailbed intact, toenail displaced upward and laterally, Dermabond used to realign and reattached to the tissues.   Critical Care  ED Course and Medical Decision Making  I have reviewed the triage vital signs and the nursing notes.  Pertinent labs & imaging results that were available during my care of the patient were reviewed by me and  considered in my medical decision making (see below for details).  Pressure toenail avulsion and is otherwise healthy 28 year old female.  Options discussed with patient, explained to patient that the toenail will likely fall off no matter what we do, because her nailbed is intact it will grow back.  Option 1 would be simple bandaging and close observation.  Option 2 would be digital block and placing some Dermabond underneath the area  of avulsed toe and trying to replace the toenail to a better alignment.  Patient prefers option 2.  Procedures performed as described above.  After the discussed management above, the patient was determined to be safe for discharge.  The patient was in agreement with this plan and all questions regarding their care were answered.  ED return precautions were discussed and the patient will return to the ED with any significant worsening of condition.   Barth Kirks. Sedonia Small, [place] [email or web address]  Final Clinical Impressions(s) / ED Diagnoses     ICD-10-CM   1. Avulsion of toenail, initial encounter  S91.209A     ED Discharge Orders    None         Maudie Flakes, MD 01/07/19 [phone]

## 2019-01-07 NOTE — Discharge Instructions (Signed)
You were evaluated in the Emergency Department and after careful evaluation, we did not find any emergent condition requiring admission or further testing in the hospital.  Your symptoms today seem to be due to a partial avulsion of your toenail.  We were able to numb the toe and place a small amount of glue underneath the toenail to put it in a better alignment.  As we discussed, your toenail will likely time.  However your nailbed is intact and you will grow a new toenail.  Please return to the Emergency Department if you experience any worsening of your condition.  We encourage you to follow up with a primary care provider.  Thank you for allowing [place] to be a part of your care.

## 2019-02-24 ENCOUNTER — Other Ambulatory Visit: Payer: Self-pay

## 2019-02-24 DIAGNOSIS — Z20822 Contact with and (suspected) exposure to covid-19: Secondary | ICD-10-CM

## 2019-02-25 LAB — NOVEL CORONAVIRUS, NAA: SARS-CoV-2, NAA: NOT DETECTED

## 2019-07-17 ENCOUNTER — Other Ambulatory Visit: Payer: Self-pay

## 2019-07-17 ENCOUNTER — Ambulatory Visit: Payer: Self-pay | Attending: Internal Medicine

## 2019-07-17 DIAGNOSIS — Z20822 Contact with and (suspected) exposure to covid-19: Secondary | ICD-10-CM | POA: Insufficient documentation

## 2019-07-18 LAB — NOVEL CORONAVIRUS, NAA: SARS-CoV-2, NAA: NOT DETECTED

## 2019-09-07 ENCOUNTER — Ambulatory Visit: Payer: Self-pay | Attending: Internal Medicine

## 2019-09-07 DIAGNOSIS — Z23 Encounter for immunization: Secondary | ICD-10-CM

## 2019-09-07 NOTE — Progress Notes (Signed)
   Covid-19 Vaccination Clinic  Name:  Alyssa Owens    MRN: [id number] DOB: 08-15-1990  09/07/2019  [name] was observed post Covid-19 immunization for 15 minutes without incident. She was provided with Vaccine Information Sheet and instruction to access the V-Safe system.   [name] was instructed to call 911 with any severe reactions post vaccine: Marland Kitchen Difficulty breathing  . Swelling of face and throat  . A fast heartbeat  . A bad rash all over body  . Dizziness and weakness   Immunizations Administered    Name Date Dose VIS Date Route   Moderna COVID-19 Vaccine 09/07/2019  1:41 PM 0.5 mL 05/09/2019 Intramuscular   Manufacturer: Moderna   Lot: [id number]   [id number]

## 2019-10-05 ENCOUNTER — Ambulatory Visit: Payer: Self-pay | Attending: Internal Medicine

## 2019-10-05 DIAGNOSIS — Z23 Encounter for immunization: Secondary | ICD-10-CM

## 2019-10-05 NOTE — Progress Notes (Signed)
   Covid-19 Vaccination Clinic  Name:  Alyssa Owens    MRN: [id number] DOB: [birth year]  10/05/2019  Alyssa Owens was observed post Covid-19 immunization for 15 minutes without incident. She was provided with Vaccine Information Sheet and instruction to access the V-Safe system.   Alyssa Owens was instructed to call 911 with any severe reactions post vaccine: Marland Kitchen Difficulty breathing  . Swelling of face and throat  . A fast heartbeat  . A bad rash all over body  . Dizziness and weakness   Immunizations Administered    Name Date Dose VIS Date Route   Moderna COVID-19 Vaccine 10/05/2019  1:07 PM 0.5 mL 05/2019 Intramuscular   Manufacturer: Moderna   Lot: [id number]   [id number]

## 2020-04-07 ENCOUNTER — Emergency Department (HOSPITAL_COMMUNITY)
Admission: EM | Admit: 2020-04-07 | Discharge: 2020-04-07 | Disposition: A | Discharge status: Home / Self Care | Payer: Self-pay | Attending: Emergency Medicine | Admitting: Emergency Medicine

## 2020-04-07 ENCOUNTER — Encounter (HOSPITAL_COMMUNITY): Payer: Self-pay | Admitting: Emergency Medicine

## 2020-04-07 ENCOUNTER — Other Ambulatory Visit: Payer: Self-pay

## 2020-04-07 DIAGNOSIS — F17210 Nicotine dependence, cigarettes, uncomplicated: Secondary | ICD-10-CM | POA: Insufficient documentation

## 2020-04-07 DIAGNOSIS — B349 Viral infection, unspecified: Secondary | ICD-10-CM | POA: Insufficient documentation

## 2020-04-07 DIAGNOSIS — Z20822 Contact with and (suspected) exposure to covid-19: Secondary | ICD-10-CM | POA: Insufficient documentation

## 2020-04-07 LAB — RESPIRATORY PANEL BY RT PCR (FLU A&B, COVID)
Influenza A by PCR: NEGATIVE
Influenza B by PCR: NEGATIVE
SARS Coronavirus 2 by RT PCR: NEGATIVE

## 2020-04-07 NOTE — ED Triage Notes (Signed)
Pt reports generalized fatigue, cough, runny nose for last several days. Pt denies any known covid exposure. Pt reports son just got over a cold. Pt reports sinus pressure initially but denies at present.

## 2020-04-07 NOTE — Discharge Instructions (Addendum)
Your Covid and influenza tests are negative today.  Your symptoms are likely related to a viral upper respiratory infection.  Antibiotics do not work on a viral infection.  You may continue to alternate Tylenol and ibuprofen if needed for body aches or fever.  You may want to try over-the-counter cold and sinus medication with a decongestant.  Follow-up with your primary doctor for recheck.  You may want to have a repeat Covid test in a few days if your symptoms worsen.

## 2020-04-07 NOTE — ED Provider Notes (Signed)
[facility] EMERGENCY DEPARTMENT Provider Note   CSN: [id number] Arrival date & time: 04/07/20  1607     History Chief Complaint  Patient presents with  . Cough    Alyssa Owens is a 29 y.o. female.  HPI      Alyssa Owens is a 29 y.o. female who presents to the Emergency Department complaining of nasal congestion, rhinorrhea, and cough with loss of taste and smell.  Symptoms have been present for 4 days.  No known Covid exposures and states she is fully vaccinated against COVID-19.  Her son had similar symptoms recently.  She reports cough as intermittent and nonproductive.  She denies chest pain, shortness of breath, abdominal pain and diarrhea.  No known fever at home.  She has been taking over-the-counter Robitussin without relief.  Here for testing for Covid.    Past Medical History:  Diagnosis Date  . Fibroid   . Medical history non-contributory   . Pregnancy     Patient Active Problem List   Diagnosis Date Noted  . 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 Status post primary low transverse cesarean section 10/30/2015  . Hemorrhoids during pregnancy in third trimester, antepartum 09/16/2015  . Marijuana use 04/10/2015  . Sickle cell trait ([place]) 04/09/2015  . Uterine fibroids 04/08/2015  . Smoker 04/08/2015    Past Surgical History:  Procedure Laterality Date  . CESAREAN SECTION N/A 10/30/2015   Procedure: CESAREAN SECTION;  Surgeon: Florian Buff, MD;  Location: [place];  Service: Obstetrics;  Laterality: N/A;  . NO PAST SURGERIES       OB History    Gravida  1   Para  1   Term  1   Preterm      AB      Living  1     SAB      TAB      Ectopic      Multiple  0   Live Births  1           Family History  Problem Relation Age of Onset  . Arthritis Mother   . Cancer Mother        breast  . Hypertension Mother   . Heart disease Father     Social History   Tobacco Use  . Smoking status: Current Every Day Smoker     Packs/day: 0.25    Years: 2.00    Pack years: 0.50    Types: Cigarettes    Last attempt to quit: 10/01/2015    Years since quitting: 4.5  . Smokeless tobacco: Never Used  Substance Use Topics  . Alcohol use: Yes    Comment: occ  . Drug use: No    Types: Marijuana    Home Medications Prior to Admission medications   Medication Sig Start Date End Date Taking? Authorizing Provider  acetaminophen (TYLENOL) 500 MG tablet Take 1,000 mg by mouth every 6 (six) hours as needed.    [provider]  medroxyPROGESTERone (DEPO-PROVERA) 150 MG/ML injection Inject 1 mL (150 mg total) into the muscle every 3 (three) months. 04/28/16   Estill Dooms, NP    Allergies    Patient has no known allergies.  Review of Systems   Review of Systems  Constitutional: Negative for appetite change, chills and fever.  HENT: Positive for congestion and rhinorrhea. Negative for ear pain, sore throat and trouble swallowing.   Respiratory: Positive for cough. Negative for chest tightness, shortness of breath and  wheezing.   Cardiovascular: Negative for chest pain.  Gastrointestinal: Negative for abdominal pain, diarrhea, nausea and vomiting.  Genitourinary: Negative for decreased urine volume and dysuria.  Musculoskeletal: Negative for arthralgias, myalgias, neck pain and neck stiffness.  Skin: Negative for rash.  Neurological: Negative for dizziness, weakness, numbness and headaches.  Hematological: Negative for adenopathy.    Physical Exam Updated Vital Signs BP (!) 147/82 (BP Location: Right Arm)   Pulse 94   Temp 99 F (37.2 C) (Oral)   Resp 18   Ht 5\' 1"  (1.549 m)   Wt 73.5 kg   SpO2 99%   BMI 30.61 kg/m   Physical Exam Vitals and nursing note reviewed.  Constitutional:      General: She is not in acute distress.    Appearance: Normal appearance.  HENT:     Right Ear: Tympanic membrane and ear canal normal.     Left Ear: Tympanic membrane and ear canal normal.     Nose:  Congestion present.     Right Turbinates: Swollen.     Left Turbinates: Swollen.     Mouth/Throat:     Mouth: Mucous membranes are moist.     Pharynx: No oropharyngeal exudate or posterior oropharyngeal erythema.     Comments: Uvula midline nonedematous.  No tonsillar exudates or edema. Cardiovascular:     Rate and Rhythm: Normal rate and regular rhythm.     Pulses: Normal pulses.  Pulmonary:     Effort: Pulmonary effort is normal. No respiratory distress.     Breath sounds: Normal breath sounds.  Abdominal:     Palpations: Abdomen is soft.     Tenderness: There is no abdominal tenderness.  Musculoskeletal:        General: Normal range of motion.     Cervical back: Normal range of motion. No rigidity.  Lymphadenopathy:     Cervical: No cervical adenopathy.  Skin:    General: Skin is warm.     Findings: No rash.  Neurological:     General: No focal deficit present.     Mental Status: She is alert.     Motor: No weakness.     ED Results / Procedures / Treatments   Labs (all labs ordered are listed, but only abnormal results are displayed) Labs Reviewed  RESPIRATORY PANEL BY RT PCR (FLU A&B, COVID)    EKG None  Radiology No results found.  Procedures Procedures (including critical care time)  Medications Ordered in ED Medications - No data to display  ED Course  I have reviewed the triage vital signs and the nursing notes.  Pertinent labs & imaging results that were available during my care of the patient were reviewed by me and considered in my medical decision making (see chart for details).    MDM Rules/Calculators/A&P                          Here with concerns for COVID-19.  She is fully vaccinated.  Developed nasal congestion, rhinorrhea, cough 4 days ago.  Her son was recently diagnosed with a cold.    On exam, patient is well-appearing.  Nontoxic.  No increased work of breathing, dyspnea, tachycardia or chest  pain. Doubt PE or PNA.  Covid and  influenza test negative.  No significant fever and lungs are clear to auscultation.  No indication for imaging at this time.  Symptoms likely viral.  Patient reassured.  I have recommended repeat testing in 4  to 5 days if symptoms are not improving.  Patient agrees to symptomatic treatment.  Return precautions discussed.  The patient appears reasonably screened and/or stabilized for discharge and I doubt any other medical condition or other [facility] requiring further screening, evaluation, or treatment in the ED at this time prior to discharge.  Final Clinical Impression(s) / ED Diagnoses Final diagnoses:  Viral illness    Rx / DC Orders ED Discharge Orders    None       Kem Parkinson, PA-C 04/07/20 1759    Wyvonnia Dusky, MD 04/08/20 [phone]

## 2020-06-13 ENCOUNTER — Other Ambulatory Visit: Payer: Self-pay

## 2020-06-13 DIAGNOSIS — Z20822 Contact with and (suspected) exposure to covid-19: Secondary | ICD-10-CM

## 2020-06-15 LAB — SARS-COV-2, NAA 2 DAY TAT

## 2020-06-15 LAB — NOVEL CORONAVIRUS, NAA: SARS-CoV-2, NAA: NOT DETECTED

## 2022-04-13 ENCOUNTER — Ambulatory Visit
Admission: EM | Admit: 2022-04-13 | Discharge: 2022-04-13 | Disposition: A | Discharge status: Home / Self Care | Payer: Medicaid Other | Attending: Physician Assistant | Admitting: Physician Assistant

## 2022-04-13 DIAGNOSIS — J069 Acute upper respiratory infection, unspecified: Secondary | ICD-10-CM | POA: Diagnosis present

## 2022-04-13 DIAGNOSIS — Z1152 Encounter for screening for COVID-19: Secondary | ICD-10-CM | POA: Diagnosis not present

## 2022-04-13 LAB — RESP PANEL BY RT-PCR (FLU A&B, COVID) ARPGX2
Influenza A by PCR: NEGATIVE
Influenza B by PCR: NEGATIVE
SARS Coronavirus 2 by RT PCR: NEGATIVE

## 2022-04-13 NOTE — ED Provider Notes (Signed)
[facility]    CSN: [id number] Arrival date & time: 04/13/22  0840      History   Chief Complaint Chief Complaint  Patient presents with   Sore Throat   Nasal Congestion    HPI Alyssa Owens is a 31 y.o. female.   Patient complains of a cough and congestion.  Patient is here with her son who has the same patient was exposed to a family member who has influenza.  Patient would like testing for influenza and COVID.  The history is provided by the patient. No language interpreter was used.  Sore Throat This is a new problem. The problem occurs constantly. The problem has been gradually worsening. Nothing aggravates the symptoms. Nothing relieves the symptoms. She has tried nothing for the symptoms. The treatment provided no relief.    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   Status post primary low transverse cesarean section 10/30/2015   Hemorrhoids during pregnancy in third trimester, antepartum 09/16/2015   Marijuana use 04/10/2015   Sickle cell trait ([place]) 04/09/2015   Uterine fibroids 04/08/2015   Smoker 04/08/2015    Past Surgical History:  Procedure Laterality Date   CESAREAN SECTION N/A 10/30/2015   Procedure: CESAREAN SECTION;  Surgeon: Florian Buff, MD;  Location: [place];  Service: Obstetrics;  Laterality: N/A;   NO PAST SURGERIES      OB History     Gravida  1   Para  1   Term  1   Preterm      AB      Living  1      SAB      IAB      Ectopic      Multiple  0   Live Births  1            Home Medications    Prior to Admission medications   Medication Sig Start Date End Date Taking? Authorizing Provider  medroxyPROGESTERone (DEPO-PROVERA) 150 MG/ML injection Inject 1 mL (150 mg total) into the muscle every 3 (three) months. 04/28/16  Yes Derrek Monaco A, NP  acetaminophen (TYLENOL) 500 MG tablet Take 1,000 mg by mouth every 6 (six) hours as needed.    [provider]    Family History Family History  Problem Relation Age of Onset   Arthritis Mother    Cancer Mother        breast   Hypertension Mother    Heart disease Father     Social History Social History   Tobacco Use   Smoking status: Every Day    Packs/day: 0.25    Years: 2.00    Total pack years: 0.50    Types: Cigarettes    Last attempt to quit: 10/01/2015    Years since quitting: 6.5   Smokeless tobacco: Never  Substance Use Topics   Alcohol use: Yes    Comment: occ   Drug use: No    Types: Marijuana     Allergies   Patient has no known allergies.   Review of Systems Review of Systems  All other systems reviewed and are negative.    Physical Exam Triage Vital Signs ED Triage Vitals  Enc Vitals Group     BP 04/13/22 0855 113/68     Pulse Rate  04/13/22 0855 71     Resp 04/13/22 0855 18     Temp 04/13/22 0855 (!) 97.3 F (36.3 C)     Temp Source 04/13/22 0855 Oral     SpO2 04/13/22 0855 97 %     Weight --      Height --      Head Circumference --      Peak Flow --      Pain Score 04/13/22 0856 5     Pain Loc --      Pain Edu? --      Excl. in Blanket? --    No data found.  Updated Vital Signs BP 113/68 (BP Location: Right Arm)   Pulse 71   Temp (!) 97.3 F (36.3 C) (Oral)   Resp 18   LMP  (LMP Unknown)   SpO2 97%   Visual Acuity Right Eye Distance:   Left Eye Distance:   Bilateral Distance:    Right Eye Near:   Left Eye Near:    Bilateral Near:     Physical Exam Vitals and nursing note reviewed.  Constitutional:      Appearance: She is well-developed.  HENT:     Head: Normocephalic.     Right Ear: Tympanic membrane normal.     Left Ear: Tympanic membrane normal.     Mouth/Throat:     Mouth: Mucous membranes are moist.  Cardiovascular:     Rate and Rhythm: Normal rate.  Pulmonary:     Effort: Pulmonary  effort is normal.  Abdominal:     General: There is no distension.  Musculoskeletal:        General: Normal range of motion.     Cervical back: Normal range of motion.  Skin:    General: Skin is warm.  Neurological:     General: No focal deficit present.     Mental Status: She is alert and oriented to person, place, and time.      UC Treatments / Results  Labs (all labs ordered are listed, but only abnormal results are displayed) Labs Reviewed  RESP PANEL BY RT-PCR (FLU A&B, COVID) [id number]    EKG   Radiology No results found.  Procedures Procedures (including critical care time)  Medications Ordered in UC Medications - No data to display  Initial Impression / Assessment and Plan / UC Course  I have reviewed the triage vital signs and the nursing notes.  Pertinent labs & imaging results that were available during my care of the patient were reviewed by me and considered in my medical decision making (see chart for details).     MDM patient looks well overall influenza and COVID are ordered patient is advised Tylenol for discomfort Final Clinical Impressions(s) / UC Diagnoses   Final diagnoses:  Acute upper respiratory infection     Discharge Instructions      Return if any problems.    ED Prescriptions   None    PDMP not reviewed this encounter. An After Visit Summary was printed and given to the patient.       Fransico Meadow, [place] 04/13/22 [phone]

## 2022-04-13 NOTE — Discharge Instructions (Signed)
Return if any problems.

## 2022-04-13 NOTE — ED Triage Notes (Signed)
Sore throat and congestion that started yesterday. Not taking any OTC medication.

## 2022-07-12 ENCOUNTER — Emergency Department (HOSPITAL_COMMUNITY)
Admission: EM | Admit: 2022-07-12 | Discharge: 2022-07-12 | Disposition: A | Discharge status: Home / Self Care | Payer: Medicaid Other | Attending: Emergency Medicine | Admitting: Emergency Medicine

## 2022-07-12 ENCOUNTER — Emergency Department (HOSPITAL_COMMUNITY): Payer: Medicaid Other

## 2022-07-12 ENCOUNTER — Other Ambulatory Visit: Payer: Self-pay

## 2022-07-12 DIAGNOSIS — R791 Abnormal coagulation profile: Secondary | ICD-10-CM | POA: Insufficient documentation

## 2022-07-12 DIAGNOSIS — F419 Anxiety disorder, unspecified: Secondary | ICD-10-CM | POA: Diagnosis not present

## 2022-07-12 DIAGNOSIS — R079 Chest pain, unspecified: Secondary | ICD-10-CM

## 2022-07-12 DIAGNOSIS — R072 Precordial pain: Secondary | ICD-10-CM | POA: Diagnosis not present

## 2022-07-12 DIAGNOSIS — R000 Tachycardia, unspecified: Secondary | ICD-10-CM | POA: Diagnosis not present

## 2022-07-12 DIAGNOSIS — F17210 Nicotine dependence, cigarettes, uncomplicated: Secondary | ICD-10-CM | POA: Insufficient documentation

## 2022-07-12 DIAGNOSIS — D72829 Elevated white blood cell count, unspecified: Secondary | ICD-10-CM | POA: Diagnosis not present

## 2022-07-12 LAB — D-DIMER, QUANTITATIVE: D-Dimer, Quant: 0.73 ug/mL-FEU — ABNORMAL HIGH (ref 0.00–0.50)

## 2022-07-12 LAB — TROPONIN I (HIGH SENSITIVITY): Troponin I (High Sensitivity): <2 ng/L (ref <18)

## 2022-07-12 LAB — CBC WITH DIFFERENTIAL/PLATELET
Abs Immature Granulocytes: 0.03 10*3/uL (ref 0.00–0.07)
Basophils Absolute: 0.1 10*3/uL (ref 0.0–0.1)
Basophils Relative: 0 %
Eosinophils Absolute: 0 10*3/uL (ref 0.0–0.5)
Eosinophils Relative: 0 %
HCT: 42.4 % (ref 36.0–46.0)
Hemoglobin: 14.7 g/dL (ref 12.0–15.0)
Immature Granulocytes: 0 %
Lymphocytes Relative: 17 %
Lymphs Abs: 2.4 10*3/uL (ref 0.7–4.0)
MCH: 29.5 pg (ref 26.0–34.0)
MCHC: 34.7 g/dL (ref 30.0–36.0)
MCV: 85.1 fL (ref 80.0–100.0)
Monocytes Absolute: 0.6 10*3/uL (ref 0.1–1.0)
Monocytes Relative: 4 %
Neutro Abs: 10.9 10*3/uL — ABNORMAL HIGH (ref 1.7–7.7)
Neutrophils Relative %: 79 %
Platelets: 267 10*3/uL (ref 150–400)
RBC: 4.98 MIL/uL (ref 3.87–5.11)
RDW: 14.1 % (ref 11.5–15.5)
WBC: 13.9 10*3/uL — ABNORMAL HIGH (ref 4.0–10.5)
nRBC: 0 % (ref 0.0–0.2)

## 2022-07-12 LAB — BASIC METABOLIC PANEL
Anion gap: 12 (ref 5–15)
BUN: 9 mg/dL (ref 6–20)
CO2: 22 mmol/L (ref 22–32)
Calcium: 9.5 mg/dL (ref 8.9–10.3)
Chloride: 107 mmol/L (ref 98–111)
Creatinine, Ser: 0.71 mg/dL (ref 0.44–1.00)
GFR, Estimated: >60 mL/min (ref >60)
Glucose, Bld: 93 mg/dL (ref 70–99)
Potassium: 3.5 mmol/L (ref 3.5–5.1)
Sodium: 141 mmol/L (ref 135–145)

## 2022-07-12 LAB — TSH: TSH: 0.907 u[IU]/mL (ref 0.350–4.500)

## 2022-07-12 LAB — POC URINE PREG, ED: Preg Test, Ur: NEGATIVE

## 2022-07-12 MED ORDER — IOHEXOL 350 MG/ML SOLN
100.0000 mL | Freq: Once | INTRAVENOUS | Status: AC | PRN
  Administered 2022-07-12: 100 mL via INTRAVENOUS

## 2022-07-12 NOTE — Discharge Instructions (Signed)
Your workup today was reassuring.  Please follow-up with your primary care provider for recheck.  Return emergency department for any new or worsening symptoms.

## 2022-07-12 NOTE — ED Triage Notes (Addendum)
Pt states that she feels like she is having a panic attack, denies any precipitating factors, states that she has had two in the past, pt c/o midsternal chest pain along with sob that started around the same time,

## 2022-07-12 NOTE — ED Provider Notes (Signed)
[facility] Provider Note   CSN: [id number] Arrival date & time: 07/12/22  0932     History {Add pertinent medical, surgical, social history, OB history to HPI:1} Chief Complaint  Patient presents with   Panic Attack    TAMMELA BALES is a 32 y.o. female.  HPI     NAJA APPERSON is a 32 y.o. female with past medical history of sickle cell trait who presents to the Emergency Department complaining of substernal chest pain that began this morning shortly after waking.  She describes sharp pains of her mid chest and feelings of rapid heart rate and shortness of breath.  Symptoms present for greater than 30 minutes.  She states she had similar episode on Friday that also lasted approximately 30 minutes before resolved.  History of what she believes is panic attacks she had a similar symptoms sometime ago.  She denies any known triggers, changes in medication or sleep.  Denies any drug use.  She does smoke cigarettes.  Chest pain still present upon ER arrival and she feels slightly short of breath.  She denies any nausea, vomiting, arm neck or jaw pain, recent fever or chills.  No history of known heart disease. Unsure of hx of thyroid disease   Home Medications Prior to Admission medications   Medication Sig Start Date End Date Taking? Authorizing Provider  acetaminophen (TYLENOL) 500 MG tablet Take 1,000 mg by mouth every 6 (six) hours as needed.    [provider]  medroxyPROGESTERone (DEPO-PROVERA) 150 MG/ML injection Inject 1 mL (150 mg total) into the muscle every 3 (three) months. 04/28/16   Estill Dooms, NP      Allergies    Patient has no known allergies.    Review of Systems   Review of Systems  Constitutional:  Negative for chills and fever.  Respiratory:  Positive for shortness of breath. Negative for cough.   Cardiovascular:  Positive for chest pain and palpitations.  Gastrointestinal:  Negative for  abdominal pain, diarrhea, nausea and vomiting.  Genitourinary:  Negative for difficulty urinating.  Musculoskeletal:  Negative for arthralgias, myalgias, neck pain and neck stiffness.  Neurological:  Negative for dizziness, syncope, speech difficulty, weakness, numbness and headaches.    Physical Exam Updated Vital Signs BP (!) 157/107   Pulse (!) 108   Temp 98.3 F (36.8 C) (Oral)   Resp (!) 26   Ht '5\' 1"'$  (1.549 m)   Wt 86.6 kg   SpO2 100%   BMI 36.09 kg/m  Physical Exam Vitals and nursing note reviewed.  Constitutional:      General: She is not in acute distress.    Appearance: Normal appearance. She is not ill-appearing or toxic-appearing.  HENT:     Mouth/Throat:     Mouth: Mucous membranes are moist.  Eyes:     Conjunctiva/sclera: Conjunctivae normal.     Pupils: Pupils are equal, round, and reactive to light.  Cardiovascular:     Rate and Rhythm: Regular rhythm. Tachycardia present.     Pulses: Normal pulses.  Pulmonary:     Effort: Pulmonary effort is normal. No respiratory distress.  Abdominal:     Palpations: Abdomen is soft.     Tenderness: There is no abdominal tenderness.  Musculoskeletal:        General: Normal range of motion.     Cervical back: Normal range of motion. No tenderness.  Lymphadenopathy:     Cervical: No cervical adenopathy.  Skin:    General: Skin is warm.     Capillary Refill: Capillary refill takes less than 2 seconds.     Findings: No rash.  Neurological:     General: No focal deficit present.     Mental Status: She is alert.     Sensory: No sensory deficit.     Motor: No weakness.     ED Results / Procedures / Treatments   Labs (all labs ordered are listed, but only abnormal results are displayed) Labs Reviewed  CBC WITH DIFFERENTIAL/PLATELET - Abnormal; Notable for the following components:      Result Value   WBC 13.9 (*)    Neutro Abs 10.9 (*)    All other components within normal limits  D-DIMER, QUANTITATIVE -  Abnormal; Notable for the following components:   D-Dimer, Quant 0.73 (*)    All other components within normal limits  BASIC METABOLIC PANEL  TSH  POC URINE PREG, ED  TROPONIN I (HIGH SENSITIVITY)    EKG None  Radiology CT Angio Chest PE W and/or Wo Contrast  Result Date: 07/12/2022 CLINICAL DATA:  Pulmonary embolism suspected EXAM: CT ANGIOGRAPHY CHEST WITH CONTRAST TECHNIQUE: Multidetector CT imaging of the chest was performed using the standard protocol during bolus administration of intravenous contrast. Multiplanar CT image reconstructions and MIPs were obtained to evaluate the vascular anatomy. RADIATION DOSE REDUCTION: This exam was performed according to the departmental dose-optimization program which includes automated exposure control, adjustment of the mA and/or kV according to patient size and/or use of iterative reconstruction technique. CONTRAST:  121m OMNIPAQUE IOHEXOL 350 MG/ML SOLN COMPARISON:  None Available. FINDINGS: Cardiovascular: Satisfactory opacification of the pulmonary arteries to the segmental level. No evidence of pulmonary embolism. Normal heart size. No pericardial effusion. Mediastinum/Nodes: No enlarged mediastinal, hilar, or axillary lymph nodes. Thyroid gland, trachea, and esophagus demonstrate no significant findings. Lungs/Pleura: Lungs are clear. No pleural effusion or pneumothorax. Upper Abdomen: No acute abnormality. Musculoskeletal: No chest wall abnormality. No acute or significant osseous findings. Review of the MIP images confirms the above findings. IMPRESSION: No evidence of pulmonary embolism or other acute intrathoracic process. Electronically Signed   By: IKeane PoliceD.O.   On: 07/12/2022 14:02   DG Chest Portable 1 View  Result Date: 07/12/2022 CLINICAL DATA:  Chest pain EXAM: PORTABLE CHEST 1 VIEW COMPARISON:  None Available. FINDINGS: The heart size and mediastinal contours are within normal limits. Both lungs are clear. The visualized skeletal  structures are unremarkable. IMPRESSION: No active disease. Electronically Signed   By: HMarin RobertsM.D.   On: 07/12/2022 11:48    Procedures Procedures  {Document cardiac monitor, telemetry assessment procedure when appropriate:1}  Medications Ordered in ED Medications - No data to display  ED Course/ Medical Decision Making/ A&P   {   Click here for ABCD2, HEART and other calculatorsREFRESH Note before signing :1}                          Medical Decision Making Patient here for recurrent episodes of chest pain shortness of breath.  Had similar episode on Friday.  Describes sharp central chest pain associated with shortness of breath lasting at least 30 minutes.  Episode on Friday spontaneously resolved.  She denies any known recent stressors, medication changes or drug use.  She is unsure of family history of thyroid disease.  On my exam, patient well-appearing.  She has some mild pleuritic central chest pain.  Tachycardic, tachypneic  and hypertensive on exam  Diff dx includes but not limited to cardiac process, PE, pneumonia, anxiety, thyroid disease      Amount and/or Complexity of Data Reviewed Labs: ordered.    Details: Labs interpreted by me, mild leukocytosis with white count of 13,000.  Low clinical suspicion that this is infectious.  Chemistries without derangement.  Troponin less than 2.  Pregnancy test negative.  TSH reassuring, her D-dimer is mildly elevated at 0.73 Radiology: ordered.    Details: Chest x-ray without acute cardiopulmonary process.  Since patient D-dimer is elevated we will proceed with CT angio of the chest for further evaluation of PE.  CT angio of the chest without evidence of pulmonary embolism. Discussion of management or test interpretation with external provider(s): Overall, patient has reassuring workup today.  No evidence of acute cardiac process or PE.  Chest pain felt to be related to anxiety.  She does have a primary care provider she is  agreeable to close outpatient follow-up.  I feel this is appropriate.  She was given ER return precautions  Risk Prescription drug management.     {Document critical care time when appropriate:1} {Document review of labs and clinical decision tools ie heart score, Chads2Vasc2 etc:1}  {Document your independent review of radiology images, and any outside records:1} {Document your discussion with family members, caretakers, and with consultants:1} {Document social determinants of health affecting pt's care:1} {Document your decision making why or why not admission, treatments were needed:1} Final Clinical Impression(s) / ED Diagnoses Final diagnoses:  None    Rx / DC Orders ED Discharge Orders     None

## 2023-03-08 ENCOUNTER — Other Ambulatory Visit (HOSPITAL_COMMUNITY): Payer: Self-pay | Admitting: Nurse Practitioner

## 2023-03-08 DIAGNOSIS — R9389 Abnormal findings on diagnostic imaging of other specified body structures: Secondary | ICD-10-CM

## 2023-03-16 ENCOUNTER — Other Ambulatory Visit (HOSPITAL_COMMUNITY)
Admission: RE | Admit: 2023-03-16 | Discharge: 2023-03-16 | Disposition: A | Discharge status: Home / Self Care | Payer: Medicaid Other | Source: Ambulatory Visit | Attending: Nurse Practitioner | Admitting: Nurse Practitioner

## 2023-03-16 DIAGNOSIS — R87810 Cervical high risk human papillomavirus (HPV) DNA test positive: Secondary | ICD-10-CM | POA: Insufficient documentation

## 2023-03-16 DIAGNOSIS — R87610 Atypical squamous cells of undetermined significance on cytologic smear of cervix (ASC-US): Secondary | ICD-10-CM | POA: Insufficient documentation

## 2023-03-18 LAB — SURGICAL PATHOLOGY

## 2023-03-23 ENCOUNTER — Other Ambulatory Visit (HOSPITAL_COMMUNITY): Payer: Self-pay | Admitting: Nurse Practitioner

## 2023-03-23 ENCOUNTER — Ambulatory Visit (HOSPITAL_COMMUNITY)
Admission: RE | Admit: 2023-03-23 | Discharge: 2023-03-23 | Disposition: A | Discharge status: Home / Self Care | Payer: Medicaid Other | Source: Ambulatory Visit | Attending: Nurse Practitioner | Admitting: Nurse Practitioner

## 2023-03-23 DIAGNOSIS — R9389 Abnormal findings on diagnostic imaging of other specified body structures: Secondary | ICD-10-CM | POA: Diagnosis present

## 2023-07-18 ENCOUNTER — Ambulatory Visit (INDEPENDENT_AMBULATORY_CARE_PROVIDER_SITE_OTHER): Payer: Medicaid Other

## 2023-07-18 ENCOUNTER — Ambulatory Visit
Admission: EM | Admit: 2023-07-18 | Discharge: 2023-07-18 | Disposition: A | Discharge status: Home / Self Care | Payer: Medicaid Other | Attending: Family Medicine | Admitting: Family Medicine

## 2023-07-18 DIAGNOSIS — S9031XA Contusion of right foot, initial encounter: Secondary | ICD-10-CM | POA: Diagnosis not present

## 2023-07-18 NOTE — ED Provider Notes (Signed)
 [facility]    CSN: [id number] Arrival date & time: 07/18/23  9163      History   Chief Complaint No chief complaint on file.   HPI Alyssa Owens is a 33 y.o. female.   Patient presenting today with 1 day history of right lateral foot pain, bruising, swelling after dropping a couch on the foot yesterday.  Denies loss of range of motion, numbness, tingling, inability to bear weight.  So far trying over-the-counter remedies with minimal relief.    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   Status post primary low transverse cesarean section 10/30/2015   Hemorrhoids during pregnancy in third trimester 09/16/2015   Marijuana use 04/10/2015   Sickle cell trait (HCC) 04/09/2015   Uterine fibroids 04/08/2015   Smoker 04/08/2015    Past Surgical History:  Procedure Laterality Date   CESAREAN SECTION N/A 10/30/2015   Procedure: CESAREAN SECTION;  Surgeon: Vonn VEAR Inch, MD;  Location: [facility] BIRTHING SUITES;  Service: Obstetrics;  Laterality: N/A;   NO PAST SURGERIES      OB History     Gravida  1   Para  1   Term  1   Preterm      AB      Living  1      SAB      IAB      Ectopic      Multiple  0   Live Births  1            Home Medications    Prior to Admission medications   Medication Sig Start Date End Date Taking? Authorizing Provider  acetaminophen  (TYLENOL ) 500 MG tablet Take 1,000 mg by mouth every 6 (six) hours as needed.    [provider]  medroxyPROGESTERone  (DEPO-PROVERA ) 150 MG/ML injection Inject 1 mL (150 mg total) into the muscle every 3 (three) months. 04/28/16   Signa Delon LABOR, NP    Family History Family History  Problem Relation Age of Onset   Arthritis Mother    Cancer Mother        breast   Hypertension Mother    Heart disease Father      Social History Social History   Tobacco Use   Smoking status: Every Day    Current packs/day: 0.00    Average packs/day: 0.3 packs/day for 2.0 years (0.5 ttl pk-yrs)    Types: Cigarettes    Start date: 09/30/2013    Last attempt to quit: 10/01/2015    Years since quitting: 7.8   Smokeless tobacco: Never  Substance Use Topics   Alcohol use: Yes    Comment: occ   Drug use: No    Types: Marijuana     Allergies   Patient has no known allergies.   Review of Systems Review of Systems Per HPI  Physical Exam Triage Vital Signs ED Triage Vitals  Encounter Vitals Group     BP 07/18/23 1043 (!) 140/99     Systolic BP Percentile --      Diastolic BP Percentile --      Pulse Rate 07/18/23 1043 71     Resp 07/18/23 1043 18     Temp 07/18/23 1043 98.1 F (36.7 C)     Temp Source 07/18/23 1043 Oral     SpO2  07/18/23 1043 99 %     Weight --      Height --      Head Circumference --      Peak Flow --      Pain Score 07/18/23 1044 10     Pain Loc --      Pain Education --      Exclude from Growth Chart --    No data found.  Updated Vital Signs BP (!) 140/99 (BP Location: Right Arm)   Pulse 71   Temp 98.1 F (36.7 C) (Oral)   Resp 18   SpO2 99%   Visual Acuity Right Eye Distance:   Left Eye Distance:   Bilateral Distance:    Right Eye Near:   Left Eye Near:    Bilateral Near:     Physical Exam Vitals and nursing note reviewed.  Constitutional:      Appearance: Normal appearance. She is not ill-appearing.  HENT:     Head: Atraumatic.  Eyes:     Extraocular Movements: Extraocular movements intact.     Conjunctiva/sclera: Conjunctivae normal.  Cardiovascular:     Rate and Rhythm: Normal rate and regular rhythm.     Heart sounds: Normal heart sounds.  Pulmonary:     Effort: Pulmonary effort is normal.     Breath sounds: Normal breath sounds.  Musculoskeletal:        General: Swelling, tenderness and signs of injury present. No deformity. Normal range  of motion.     Cervical back: Normal range of motion and neck supple.     Comments: Area of mild edema, significant tenderness to palpation to the dorsal right lateral foot.  No bone deformity palpable, range of motion intact  Skin:    General: Skin is warm and dry.  Neurological:     Mental Status: She is alert and oriented to person, place, and time.     Motor: No weakness.     Gait: Gait normal.     Comments: Right lower extremity neurovascularly intact  Psychiatric:        Mood and Affect: Mood normal.        Thought Content: Thought content normal.        Judgment: Judgment normal.      UC Treatments / Results  Labs (all labs ordered are listed, but only abnormal results are displayed) Labs Reviewed - No data to display  EKG   Radiology DG Foot Complete Right Result Date: 07/18/2023 CLINICAL DATA:  dropped couch on the right foot while moving it yesterday. EXAM: RIGHT FOOT COMPLETE - 3+ VIEW COMPARISON:  None Available. FINDINGS: There is no evidence of fracture or dislocation. There is no evidence of arthropathy or other focal bone abnormality. Soft tissues are unremarkable. IMPRESSION: Negative. Electronically Signed   By: Selinda DELENA Blue M.D.   On: 07/18/2023 10:59    Procedures Procedures (including critical care time)  Medications Ordered in UC Medications - No data to display  Initial Impression / Assessment and Plan / UC Course  I have reviewed the triage vital signs and the nursing notes.  Pertinent labs & imaging results that were available during my care of the patient were reviewed by me and considered in my medical decision making (see chart for details).     X-ray of the right foot negative for acute bony abnormality.  Suspect foot contusion from incident.  Discussed RICE protocol, over-the-counter pain relievers and supportive home care.  Return for worsening symptoms.  Final  Clinical Impressions(s) / UC Diagnoses   Final diagnoses:  Contusion of right  foot, initial encounter   Discharge Instructions   None    ED Prescriptions   None    PDMP not reviewed this encounter.   Stuart Vernell Norris, [place] 07/18/23 1115

## 2023-07-18 NOTE — ED Triage Notes (Addendum)
 Pt report injury to the right foot with pain and swelling x 1 day. Pt states she as trying to move a couch by her self and dropped the couch on her pain is on the top of the foot as well as the side of the foot.

## 2024-02-20 ENCOUNTER — Other Ambulatory Visit: Payer: Self-pay

## 2024-02-20 ENCOUNTER — Encounter (HOSPITAL_COMMUNITY): Payer: Self-pay | Admitting: *Deleted

## 2024-02-20 ENCOUNTER — Emergency Department (HOSPITAL_COMMUNITY)
Admission: EM | Admit: 2024-02-20 | Discharge: 2024-02-20 | Disposition: A | Discharge status: Home / Self Care | Attending: Emergency Medicine | Admitting: Emergency Medicine

## 2024-02-20 DIAGNOSIS — U071 COVID-19: Secondary | ICD-10-CM | POA: Insufficient documentation

## 2024-02-20 DIAGNOSIS — M791 Myalgia, unspecified site: Secondary | ICD-10-CM | POA: Diagnosis present

## 2024-02-20 LAB — RESP PANEL BY RT-PCR (RSV, FLU A&B, COVID)  RVPGX2
Influenza A by PCR: NEGATIVE
Influenza B by PCR: NEGATIVE
Resp Syncytial Virus by PCR: NEGATIVE
SARS Coronavirus 2 by RT PCR: POSITIVE — AB

## 2024-02-20 MED ORDER — SODIUM CHLORIDE 0.9 % IV BOLUS
1000.0000 mL | Freq: Once | INTRAVENOUS | Status: AC
  Administered 2024-02-20: 1000 mL via INTRAVENOUS

## 2024-02-20 MED ORDER — ONDANSETRON 4 MG PO TBDP
4.0000 mg | ORAL_TABLET | Freq: Once | ORAL | Status: AC | PRN
  Administered 2024-02-20: 4 mg via ORAL

## 2024-02-20 MED ORDER — ACETAMINOPHEN 325 MG PO TABS: 650.0000 mg | ORAL_TABLET | Freq: Once | ORAL | Status: DC | PRN

## 2024-02-20 MED ORDER — IBUPROFEN 400 MG PO TABS
400.0000 mg | ORAL_TABLET | Freq: Once | ORAL | Status: AC | PRN
  Administered 2024-02-20: 400 mg via ORAL
  Filled 2024-02-20: qty 1

## 2024-02-20 MED ORDER — ACETAMINOPHEN 500 MG PO TABS
1000.0000 mg | ORAL_TABLET | Freq: Once | ORAL | Status: AC
  Administered 2024-02-20: 1000 mg via ORAL
  Filled 2024-02-20: qty 2

## 2024-02-20 MED ORDER — ONDANSETRON 4 MG PO TBDP: 4.0000 mg | ORAL_TABLET | Freq: Four times a day (QID) | ORAL | 0 refills | Status: AC | PRN

## 2024-02-20 NOTE — ED Notes (Signed)
 Pt took tylenol  around 0930 today.

## 2024-02-20 NOTE — ED Provider Notes (Addendum)
 [place] EMERGENCY DEPARTMENT AT [facility] Provider Note   CSN: [id number] Arrival date & time: 02/20/24  1052     Patient presents with: Generalized Body Aches   Alyssa Owens is a 33 y.o. female.  She denies any chronic medical conditions.  Presents ER today complaining of bodyaches, congestion, sore throat and headache, no chest pain, no shortness of breath, minimal cough.  Denies sick contacts.  She has not any vomiting without some mild nausea.  Denies urinary symptoms.  Denies chance of pregnancy.  She took Tylenol  at home this morning with minimal improvement.   HPI     Prior to Admission medications   Medication Sig Start Date End Date Taking? Authorizing Provider  ondansetron  (ZOFRAN -ODT) 4 MG disintegrating tablet Take 1 tablet (4 mg total) by mouth every 6 (six) hours as needed for nausea or vomiting. 02/20/24  Yes Jessie Cowher A, PA-C  acetaminophen  (TYLENOL ) 500 MG tablet Take 1,000 mg by mouth every 6 (six) hours as needed.    [provider]  medroxyPROGESTERone  (DEPO-PROVERA ) 150 MG/ML injection Inject 1 mL (150 mg total) into the muscle every 3 (three) months. 04/28/16   Signa Delon LABOR, NP    Allergies: Patient has no known allergies.    Review of Systems  Updated Vital Signs BP 111/70   Pulse 95   Temp 100.1 F (37.8 C)   Resp 20   Ht 5' 1 (1.549 m)   Wt 81.6 kg   SpO2 98%   BMI 34.01 kg/m   Physical Exam Vitals and nursing note reviewed.  Constitutional:      General: She is not in acute distress.    Appearance: She is well-developed.  HENT:     Head: Normocephalic and atraumatic.     Nose: Congestion present.     Mouth/Throat:     Mouth: Mucous membranes are moist.     Pharynx: Posterior oropharyngeal erythema present. No oropharyngeal exudate.     Comments: Patient speaks in full and clear sentences and handles her oral secretions well Eyes:     Conjunctiva/sclera: Conjunctivae normal.  Cardiovascular:      Rate and Rhythm: Normal rate and regular rhythm.     Heart sounds: No murmur heard. Pulmonary:     Effort: Pulmonary effort is normal. No respiratory distress.     Breath sounds: Normal breath sounds.  Abdominal:     Palpations: Abdomen is soft.     Tenderness: There is no abdominal tenderness.  Musculoskeletal:        General: No swelling.     Cervical back: Neck supple.  Skin:    General: Skin is warm and dry.     Capillary Refill: Capillary refill takes less than 2 seconds.  Neurological:     General: No focal deficit present.     Mental Status: She is alert and oriented to person, place, and time.  Psychiatric:        Mood and Affect: Mood normal.     (all labs ordered are listed, but only abnormal results are displayed) Labs Reviewed  RESP PANEL BY RT-PCR (RSV, FLU A&B, COVID)  RVPGX2 - Abnormal; Notable for the following components:      Result Value   SARS Coronavirus 2 by RT PCR POSITIVE (*)    All other components within normal limits    EKG: None  Radiology: No results found.   Procedures   Medications Ordered in the ED  ondansetron  (ZOFRAN -ODT) disintegrating tablet 4  mg (4 mg Oral Given 02/20/24 1136)  ibuprofen  (ADVIL ) tablet 400 mg (400 mg Oral Given 02/20/24 1208)  sodium chloride  0.9 % bolus 1,000 mL (1,000 mLs Intravenous New Bag/Given 02/20/24 1355)  acetaminophen  (TYLENOL ) tablet 1,000 mg (1,000 mg Oral Given 02/20/24 1349)                                    Medical Decision Making Differential diagnosis diagnosis includes but not limited to viral syndrome, pneumonia, dehydration, migraine, tension headache, other  ED course: Patient is here for body aches, chills, headache she is noted to be febrile and tachycardic on triage, after fever control and fluids she is feeling better and vitals are normalized she has no meningeal signs or symptoms, no abdominal tenderness, positive for COVID-19.  She is feeling somewhat better and wants to go home.  Her  exam is overall reassuring symptoms likely due to this viral process.  She has no significant medical problems putting her at high risk for hospitalization or death but she was given strict return precautions.  Risk OTC drugs. Prescription drug management.        Final diagnoses:  COVID-19    ED Discharge Orders          Ordered    ondansetron  (ZOFRAN -ODT) 4 MG disintegrating tablet  Every 6 hours PRN        02/20/24 1435               Alyssa Sherran LABOR, PA-C 02/20/24 1438    Alyssa Sherran LABOR, PA-C 02/20/24 1438    Alyssa Ozell BROCKS, MD 02/20/24 [phone]

## 2024-02-20 NOTE — Discharge Instructions (Addendum)
 Pleasure taking care of you today.  You were seen in the ER for fever, chills body aches and are positive for COVID-19 which is likely the cause of your symptoms.  Drink plenty of fluids, rest and over-the-counter Tylenol  and ibuprofen  can be taken as directed on packaging as needed for discomfort.  I will also prescribing Zofran  to help with given the nausea.  Please go to the ER if you have new or worsening symptoms especially if you have trouble breathing, chest pain, passing out.  Otherwise important for you to stay home for the next 5 days until you are symptom-free to prevent spreading your illness.

## 2024-02-20 NOTE — ED Triage Notes (Signed)
 Pt with body aches and SOB, unknown of fevers.  Unknown of sick contacts.  Symptoms started this morning.

## 2024-05-16 ENCOUNTER — Other Ambulatory Visit (HOSPITAL_COMMUNITY)
Admission: RE | Admit: 2024-05-16 | Discharge: 2024-05-16 | Disposition: A | Discharge status: Home / Self Care | Source: Ambulatory Visit | Attending: Nurse Practitioner | Admitting: Nurse Practitioner

## 2024-05-22 LAB — SURGICAL PATHOLOGY

## 2024-06-14 ENCOUNTER — Encounter: Payer: Self-pay | Admitting: Emergency Medicine

## 2024-06-14 ENCOUNTER — Ambulatory Visit: Admission: EM | Admit: 2024-06-14 | Discharge: 2024-06-14 | Disposition: A | Discharge status: Home / Self Care

## 2024-06-14 ENCOUNTER — Other Ambulatory Visit: Payer: Self-pay

## 2024-06-14 DIAGNOSIS — J029 Acute pharyngitis, unspecified: Secondary | ICD-10-CM | POA: Diagnosis not present

## 2024-06-14 DIAGNOSIS — J069 Acute upper respiratory infection, unspecified: Secondary | ICD-10-CM | POA: Diagnosis not present

## 2024-06-14 LAB — POCT INFLUENZA A/B
Influenza A, POC: NEGATIVE
Influenza B, POC: NEGATIVE

## 2024-06-14 LAB — POC SOFIA SARS ANTIGEN FIA: SARS Coronavirus 2 Ag: NEGATIVE

## 2024-06-14 LAB — POCT RAPID STREP A (OFFICE): Rapid Strep A Screen: NEGATIVE

## 2024-06-14 NOTE — ED Triage Notes (Signed)
 Pt reports sore throat, runny nose since yesterday. Headache this am. Pt inquiring about flu. Reports flu exposure at work.

## 2024-06-14 NOTE — ED Provider Notes (Signed)
 " [facility]    CSN: [id number] Arrival date & time: 06/14/24  0856      History   Chief Complaint Chief Complaint  Patient presents with   Sore Throat    HPI Alyssa Owens is a 34 y.o. female.   The history is provided by the patient.   Patient presents for complaints of sore throat, runny nose, and headache.  Symptoms started over the past 24 hours.  Rates throat pain 7/10 at present.  Patient denies fever, chills, body aches, nasal congestion, cough, abdominal pain, nausea, vomiting, diarrhea, or rash.  Patient states that she was around others who had the flu.  So far, states she has not taken any medications for her symptoms.  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   Status post primary low transverse cesarean section 10/30/2015   Hemorrhoids during pregnancy in third trimester 09/16/2015   Marijuana use 04/10/2015   Sickle cell trait 04/09/2015   Uterine fibroids 04/08/2015   Smoker 04/08/2015    Past Surgical History:  Procedure Laterality Date   CESAREAN SECTION N/A 10/30/2015   Procedure: CESAREAN SECTION;  Surgeon: Vonn VEAR Inch, MD;  Location: [facility] SUIT[facility];  Service: Obstetrics;  Laterality: N/A;   NO PAST SURGERIES      OB History     Gravida  1   Para  1   Term  1   Preterm      AB      Living  1      SAB      IAB      Ectopic      Multiple  0   Live Births  1            Home Medications    Prior to Admission medications  Medication Sig Start Date End Date Taking? Authorizing Provider  acetaminophen  (TYLENOL ) 500 MG tablet Take 1,000 mg by mouth every 6 (six) hours as needed.    [provider]  medroxyPROGESTERone  (DEPO-PROVERA ) 150 MG/ML injection Inject 1 mL (150 mg total) into the muscle every 3 (three) months. 04/28/16   Signa Delon LABOR,  NP  ondansetron  (ZOFRAN -ODT) 4 MG disintegrating tablet Take 1 tablet (4 mg total) by mouth every 6 (six) hours as needed for nausea or vomiting. 02/20/24   Suellen Cantor A, PA-C  triamcinolone cream (KENALOG) 0.1 % May apply small amount to affected area twice daily x two weeks, then apply twice weekly    [provider]    Family History Family History  Problem Relation Age of Onset   Arthritis Mother    Cancer Mother        breast   Hypertension Mother    Heart disease Father     Social History Social History[1]   Allergies   Patient has no known allergies.   Review of Systems Review of Systems Per HPI  Physical Exam Triage Vital Signs ED Triage Vitals  Encounter Vitals Group     BP 06/14/24 0908 138/67     Girls Systolic BP Percentile --      Girls Diastolic BP Percentile --      Boys Systolic BP Percentile --      Boys Diastolic BP Percentile --      Pulse Rate 06/14/24 0908 67  Resp 06/14/24 0908 20     Temp 06/14/24 0908 99 F (37.2 C)     Temp Source 06/14/24 0908 Oral     SpO2 06/14/24 0908 99 %     Weight --      Height --      Head Circumference --      Peak Flow --      Pain Score 06/14/24 0910 7     Pain Loc --      Pain Education --      Exclude from Growth Chart --    No data found.  Updated Vital Signs BP 138/67 (BP Location: Right Arm)   Pulse 67   Temp 99 F (37.2 C) (Oral)   Resp 20   SpO2 99%   Visual Acuity Right Eye Distance:   Left Eye Distance:   Bilateral Distance:    Right Eye Near:   Left Eye Near:    Bilateral Near:     Physical Exam Vitals and nursing note reviewed.  Constitutional:      General: She is not in acute distress.    Appearance: Normal appearance.  HENT:     Head: Normocephalic.     Right Ear: Tympanic membrane, ear canal and external ear normal.     Left Ear: Tympanic membrane, ear canal and external ear normal.     Nose: Rhinorrhea present.     Mouth/Throat:     Mouth: Mucous  membranes are moist.     Pharynx: Posterior oropharyngeal erythema present.  Eyes:     Extraocular Movements: Extraocular movements intact.     Conjunctiva/sclera: Conjunctivae normal.     Pupils: Pupils are equal, round, and reactive to light.  Cardiovascular:     Rate and Rhythm: Normal rate and regular rhythm.     Pulses: Normal pulses.     Heart sounds: Normal heart sounds.  Pulmonary:     Effort: Pulmonary effort is normal. No respiratory distress.     Breath sounds: Normal breath sounds. No stridor. No wheezing, rhonchi or rales.  Abdominal:     General: Bowel sounds are normal.     Palpations: Abdomen is soft.  Musculoskeletal:     Cervical back: Normal range of motion.  Skin:    General: Skin is warm and dry.  Neurological:     General: No focal deficit present.     Mental Status: She is alert and oriented to person, place, and time.  Psychiatric:        Mood and Affect: Mood normal.        Behavior: Behavior normal.      UC Treatments / Results  Labs (all labs ordered are listed, but only abnormal results are displayed) Labs Reviewed  POCT INFLUENZA A/B - Normal  POCT RAPID STREP A (OFFICE) - Normal  POC SOFIA SARS ANTIGEN FIA    EKG   Radiology No results found.  Procedures Procedures (including critical care time)  Medications Ordered in UC Medications - No data to display  Initial Impression / Assessment and Plan / UC Course  I have reviewed the triage vital signs and the nursing notes.  Pertinent labs & imaging results that were available during my care of the patient were reviewed by me and considered in my medical decision making (see chart for details).  COVID test, rapid strep test, and influenza test were negative.  On exam, the patient's lung sounds are clear throughout, room air sats are at 99%.  She  is well-appearing, is in no acute distress and vital signs are stable.  Symptoms was likely viral etiology.  Supportive care recommendations  were provided and discussed with the patient to include warm salt water gargles, use of Chloraseptic throat spray or throat lozenges, and over-the-counter analgesics.  Discussed indications with the patient regarding follow-up.  Patient was in agreement with this plan of care and verbalizes understanding.  All questions were answered.  Patient stable for discharge.  Work note was provided.  Final Clinical Impressions(s) / UC Diagnoses   Final diagnoses:  Sore throat  Viral URI     Discharge Instructions      The rapid strep test, COVID test, and influenza test were negative. You may take over-the-counter Tylenol  or ibuprofen  as needed for pain, fever, or general discomfort. Warm salt water gargles 3-4 times daily as needed for throat pain or discomfort.  You may also use over-the-counter Chloraseptic throat spray or throat lozenges while symptoms persist. Recommend the use of normal saline nasal spray throughout the day for nasal congestion or runny nose. You may develop more symptoms over the next 1 to 2 days.  Seek care if you develop fever greater than 102, or new symptoms of wheezing, shortness of breath, or other concerns. Symptoms should begin to improve over the next 5 to 7 days.  If symptoms fail to improve, or begin to worsen, you may follow-up in this clinic or with your primary care physician for further evaluation. Follow-up as needed.     ED Prescriptions   None    PDMP not reviewed this encounter.    [1]  Social History Tobacco Use   Smoking status: Every Day    Current packs/day: 0.00    Average packs/day: 0.3 packs/day for 2.0 years (0.5 ttl pk-yrs)    Types: Cigarettes    Start date: 09/30/2013    Last attempt to quit: 10/01/2015    Years since quitting: 8.7   Smokeless tobacco: Never  Substance Use Topics   Alcohol use: Not Currently    Comment: occ   Drug use: No    Types: Marijuana     Gilmer Etta PARAS, NP 06/14/24 0940  "

## 2024-06-14 NOTE — Discharge Instructions (Addendum)
 The rapid strep test, COVID test, and influenza test were negative. You may take over-the-counter Tylenol  or ibuprofen  as needed for pain, fever, or general discomfort. Warm salt water gargles 3-4 times daily as needed for throat pain or discomfort.  You may also use over-the-counter Chloraseptic throat spray or throat lozenges while symptoms persist. Recommend the use of normal saline nasal spray throughout the day for nasal congestion or runny nose. You may develop more symptoms over the next 1 to 2 days.  Seek care if you develop fever greater than 102, or new symptoms of wheezing, shortness of breath, or other concerns. Symptoms should begin to improve over the next 5 to 7 days.  If symptoms fail to improve, or begin to worsen, you may follow-up in this clinic or with your primary care physician for further evaluation. Follow-up as needed.
